# Patient Record
Sex: Female | Born: 1980 | Race: White | Hispanic: No | Smoking: Former smoker
Health system: Southern US, Community
[De-identification: ages and names within clinical notes are randomized; demographics above are authoritative.]

## PROBLEM LIST (undated history)

## (undated) DIAGNOSIS — Z789 Other specified health status: Secondary | ICD-10-CM

## (undated) HISTORY — PX: NO PAST SURGERIES: SHX2092

---

## 2009-08-31 ENCOUNTER — Inpatient Hospital Stay (HOSPITAL_COMMUNITY): Admission: AD | Admit: 2009-08-31 | Discharge: 2009-08-31 | Payer: Self-pay | Admitting: Obstetrics & Gynecology

## 2009-10-10 ENCOUNTER — Inpatient Hospital Stay (HOSPITAL_COMMUNITY): Admission: AD | Admit: 2009-10-10 | Discharge: 2009-10-11 | Payer: Self-pay | Admitting: Obstetrics and Gynecology

## 2010-05-23 NOTE — L&D Delivery Note (Signed)
Delivery Note At  a viable female was delivered via  OA  APGAR: 9 and 9  Placenta status delivered spontaneously and intact with a 2 vessel cord .  Cord pH: not obtained  Anesthesia:  epidural Episiotomy: none Lacerations: none Suture Repair: none Est. Blood Loss (mL): 300  Mom to postpartum.  Baby to nursery-stable.  Nguyet Mercer L 01/18/2011, 10:13 AM

## 2010-07-13 LAB — RPR: RPR: NONREACTIVE

## 2010-07-13 LAB — ABO/RH: RH Type: POSITIVE

## 2010-08-09 LAB — CBC
HCT: 34.8 % — ABNORMAL LOW (ref 36.0–46.0)
MCHC: 33.9 g/dL (ref 30.0–36.0)
MCV: 95.8 fL (ref 78.0–100.0)
Platelets: 160 10*3/uL (ref 150–400)
Platelets: 175 10*3/uL (ref 150–400)
RBC: 3.64 MIL/uL — ABNORMAL LOW (ref 3.87–5.11)
RDW: 13.9 % (ref 11.5–15.5)
WBC: 14.6 10*3/uL — ABNORMAL HIGH (ref 4.0–10.5)

## 2010-08-09 LAB — URIC ACID: Uric Acid, Serum: 4.7 mg/dL (ref 2.4–7.0)

## 2010-08-09 LAB — COMPREHENSIVE METABOLIC PANEL
BUN: 7 mg/dL (ref 6–23)
CO2: 24 mEq/L (ref 19–32)
Chloride: 104 mEq/L (ref 96–112)
Creatinine, Ser: 0.6 mg/dL (ref 0.4–1.2)
GFR calc non Af Amer: >60 mL/min (ref >60)
Total Bilirubin: 0.6 mg/dL (ref 0.3–1.2)

## 2010-08-09 LAB — RPR: RPR Ser Ql: NONREACTIVE

## 2010-08-11 LAB — URINALYSIS, ROUTINE W REFLEX MICROSCOPIC
Nitrite: NEGATIVE
Specific Gravity, Urine: >1.03 — ABNORMAL HIGH (ref 1.005–1.030)
Urobilinogen, UA: 0.2 mg/dL (ref 0.0–1.0)

## 2010-08-11 LAB — URINE CULTURE: Colony Count: 5000

## 2010-08-11 LAB — URINE MICROSCOPIC-ADD ON

## 2010-12-16 ENCOUNTER — Inpatient Hospital Stay (HOSPITAL_COMMUNITY)
Admission: AD | Admit: 2010-12-16 | Discharge: 2010-12-17 | DRG: 782 | Disposition: A | Discharge status: Home / Self Care | Payer: Medicaid Other | Source: Ambulatory Visit | Attending: Obstetrics and Gynecology | Admitting: Obstetrics and Gynecology

## 2010-12-16 ENCOUNTER — Encounter (HOSPITAL_COMMUNITY): Payer: Self-pay | Admitting: *Deleted

## 2010-12-16 DIAGNOSIS — Q270 Congenital absence and hypoplasia of umbilical artery: Secondary | ICD-10-CM

## 2010-12-16 DIAGNOSIS — IMO0002 Reserved for concepts with insufficient information to code with codable children: Secondary | ICD-10-CM

## 2010-12-16 DIAGNOSIS — O365990 Maternal care for other known or suspected poor fetal growth, unspecified trimester, not applicable or unspecified: Principal | ICD-10-CM | POA: Diagnosis present

## 2010-12-16 HISTORY — DX: Reserved for concepts with insufficient information to code with codable children: IMO0002

## 2010-12-16 HISTORY — DX: Other specified health status: Z78.9

## 2010-12-16 HISTORY — DX: Congenital absence and hypoplasia of umbilical artery: Q27.0

## 2010-12-16 MED ORDER — ZOLPIDEM TARTRATE 10 MG PO TABS
10.0000 mg | ORAL_TABLET | Freq: Every evening | ORAL | Status: DC | PRN
  Administered 2010-12-17: 10 mg via ORAL
  Filled 2010-12-16: qty 1

## 2010-12-16 MED ORDER — ACETAMINOPHEN 325 MG PO TABS
650.0000 mg | ORAL_TABLET | ORAL | Status: DC | PRN
  Administered 2010-12-17: 650 mg via ORAL
  Filled 2010-12-16: qty 2

## 2010-12-16 MED ORDER — DOCUSATE SODIUM 100 MG PO CAPS
100.0000 mg | ORAL_CAPSULE | Freq: Every day | ORAL | Status: DC
  Administered 2010-12-17: 100 mg via ORAL
  Filled 2010-12-16: qty 1

## 2010-12-16 MED ORDER — CALCIUM CARBONATE ANTACID 500 MG PO CHEW: 2.0000 | CHEWABLE_TABLET | ORAL | Status: DC | PRN

## 2010-12-16 MED ORDER — BETAMETHASONE SOD PHOS & ACET 6 (3-3) MG/ML IJ SUSP
12.0000 mg | INTRAMUSCULAR | Status: AC
  Administered 2010-12-16 – 2010-12-17 (×2): 12 mg via INTRAMUSCULAR
  Filled 2010-12-16 (×2): qty 2

## 2010-12-16 MED ORDER — PRENATAL PLUS 27-1 MG PO TABS
1.0000 | ORAL_TABLET | Freq: Every day | ORAL | Status: DC
  Administered 2010-12-17: 1 via ORAL
  Filled 2010-12-16: qty 1

## 2010-12-16 NOTE — H&P (Signed)
30 yo G4P3 at 32 +3 wks presents for admission w/ IUGR>  Pt has been followed w/ serial [place] b/c of SUA.  [place] today shows growth at 10% (previous 55%).  AFV and dopplers wnl.  Pt denies VB, ctx, and lof.  Reports good FM.    Past History - See [facility]   SUA   H/o SVD x 3 w/ rapid labor  AF, VSS Gen - NAD Abd - gravid, NT PV - deferred  [place]:  EFW 1420gms (10%), AFI 14.4.  S/D ratio 2.65, 2VC  A/P:  Admit for steroids and fetal monitoring  MFM consult in am.

## 2010-12-17 ENCOUNTER — Encounter (HOSPITAL_COMMUNITY): Payer: Self-pay | Admitting: Radiology

## 2010-12-17 ENCOUNTER — Inpatient Hospital Stay (HOSPITAL_COMMUNITY): Payer: Medicaid Other

## 2010-12-17 IMAGING — US US OB COMP +14 WK
1 series · 14 of 28 positions shown · non-contrast
Comparison: none

[Series 1: us ob comp +14 wk · 0.24mm/px · 14 of 90 slices shown]
[im 4/90]
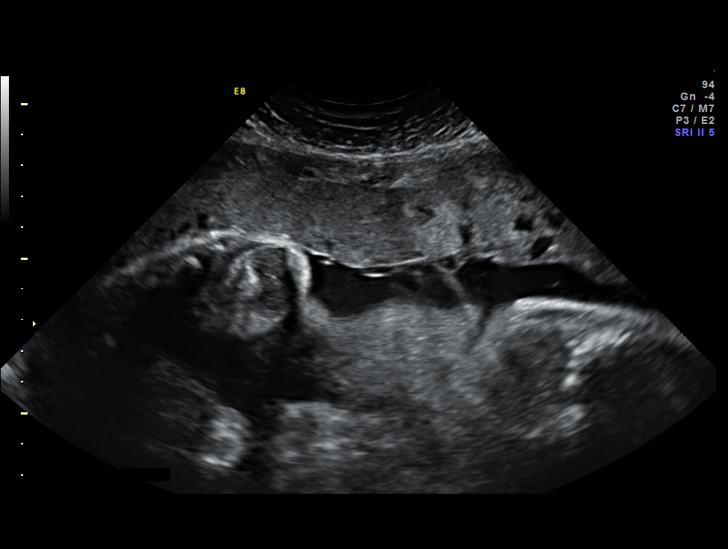
[im 10/90]
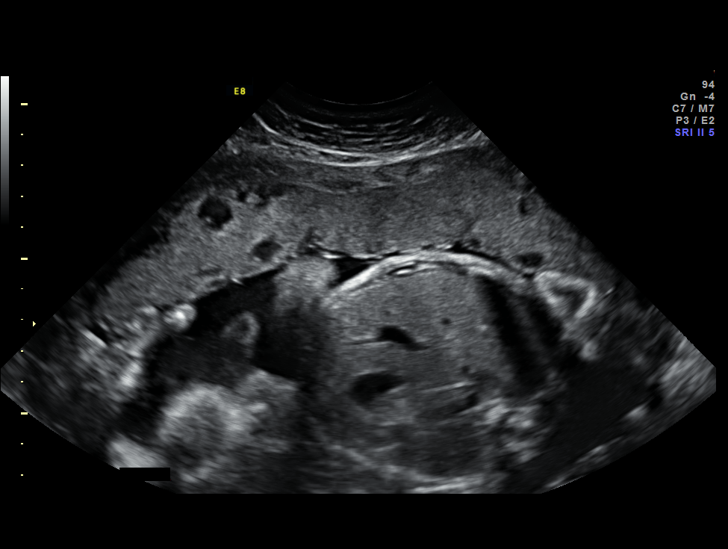
[im 17/90]
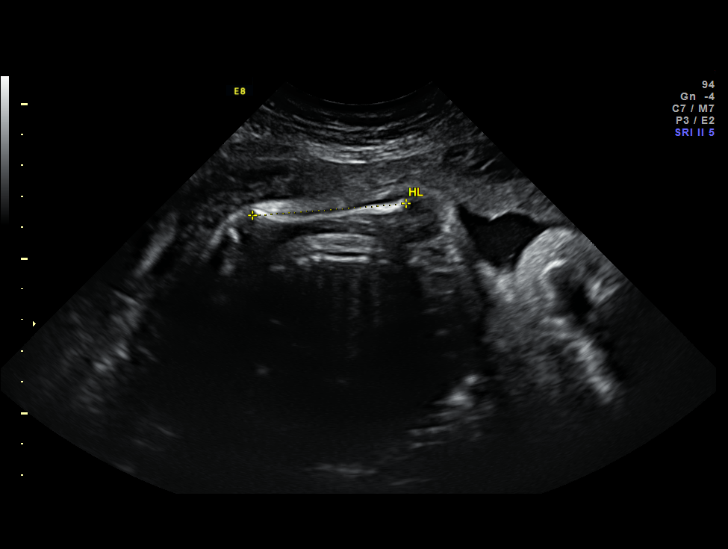
[im 24/90]
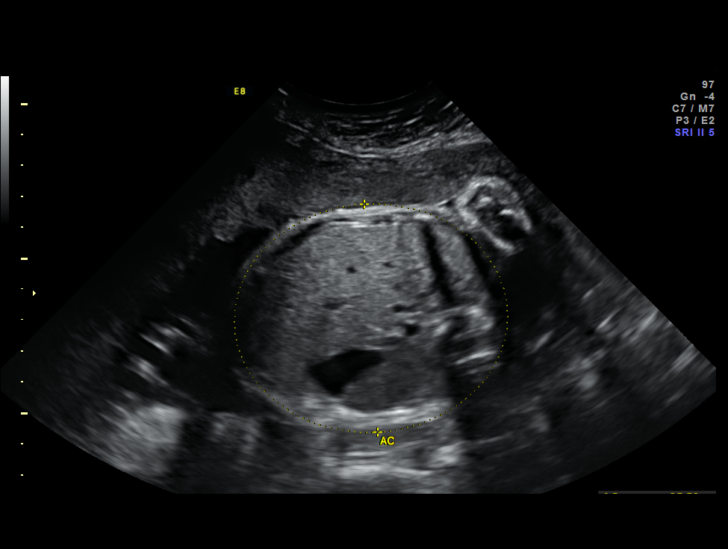
[im 30/90]
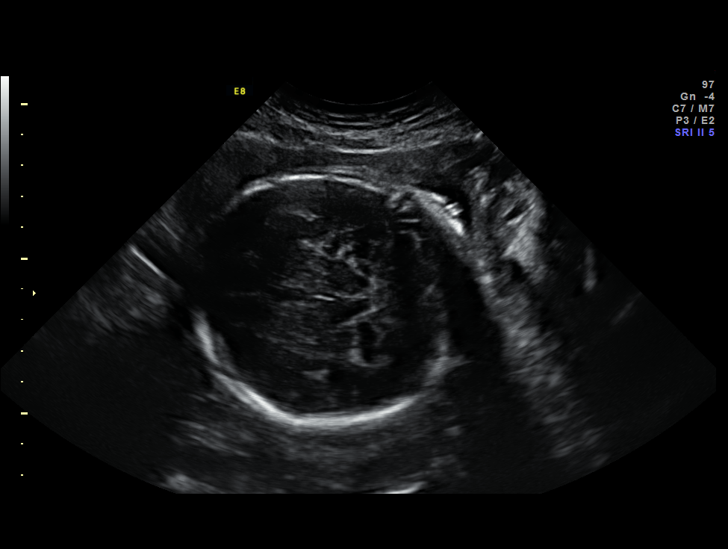
[im 37/90]
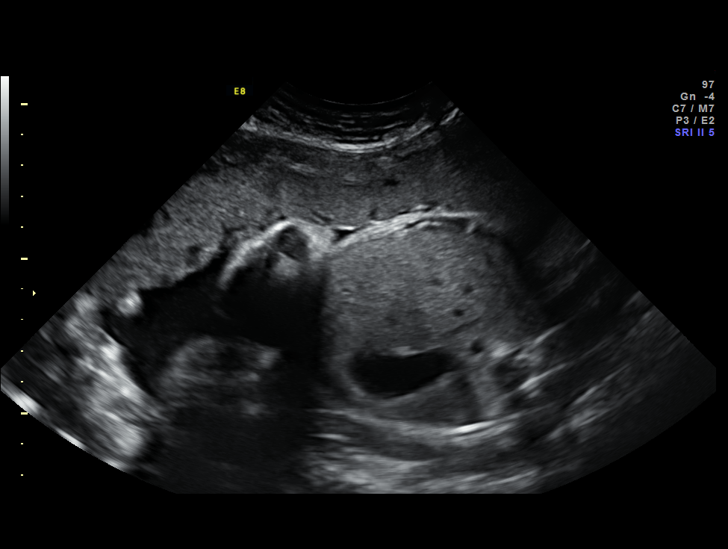
[im 43/90]
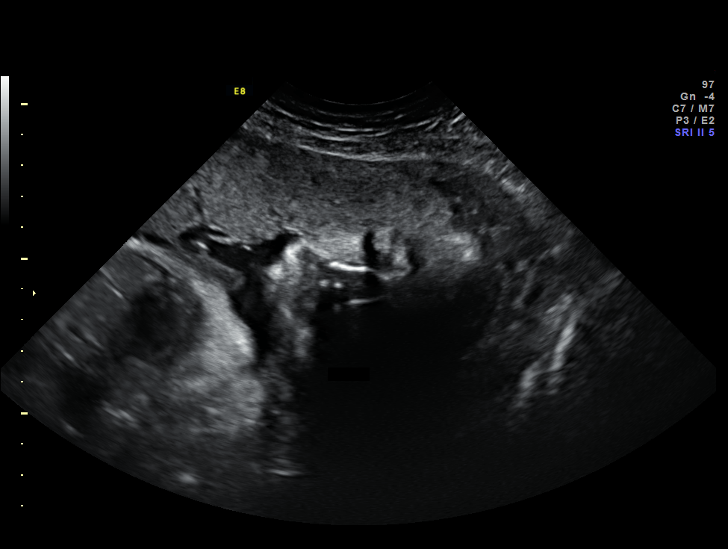
[im 50/90]
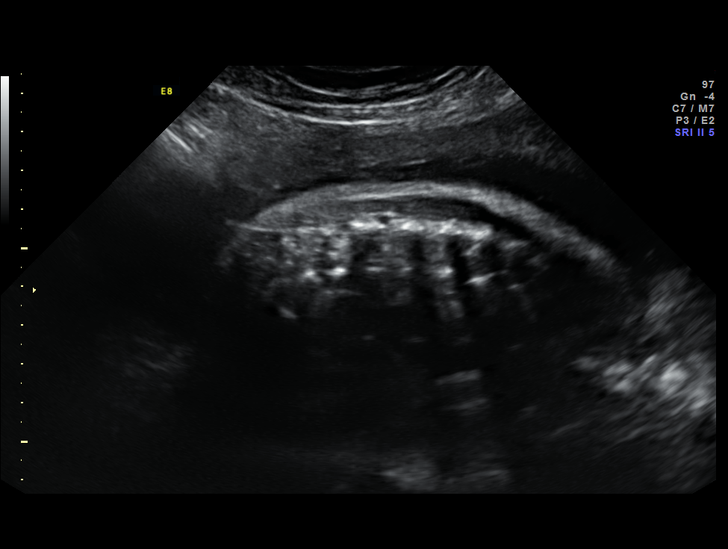
[im 57/90]
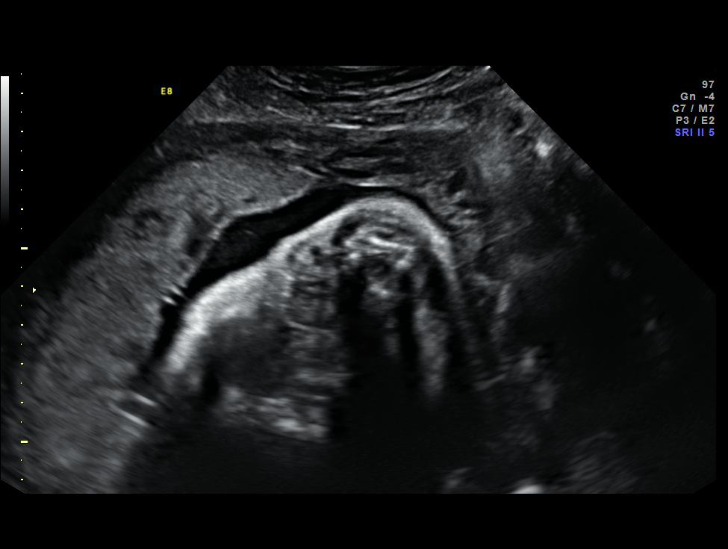
[im 63/90]
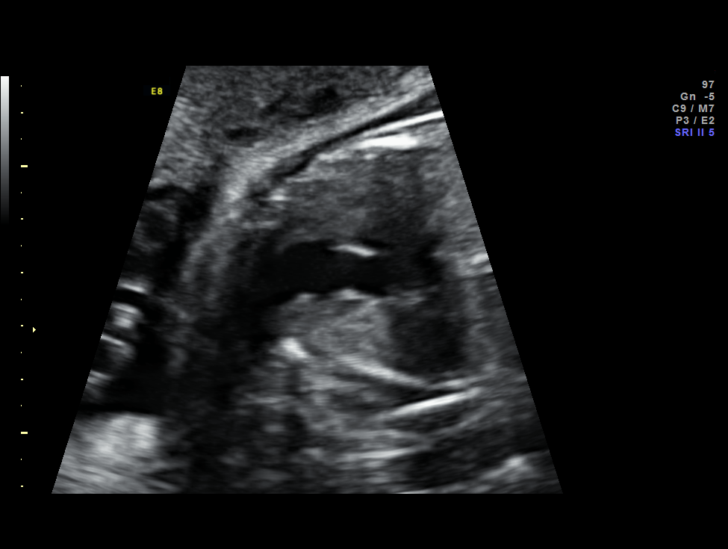
[im 70/90]
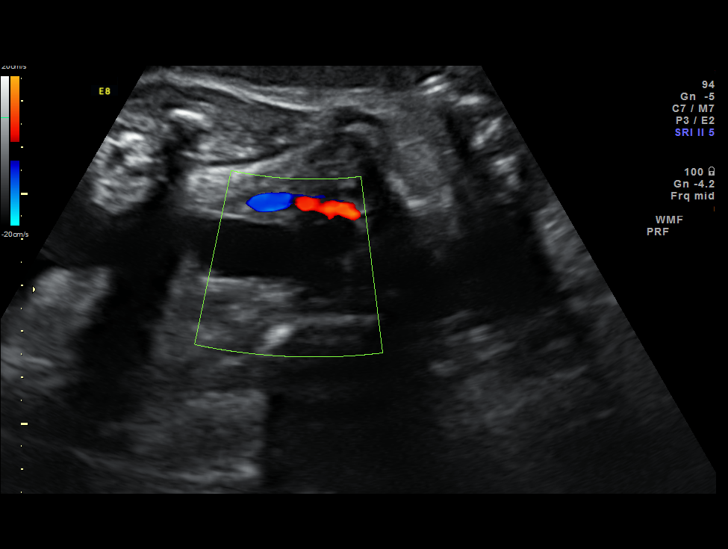
[im 76/90]
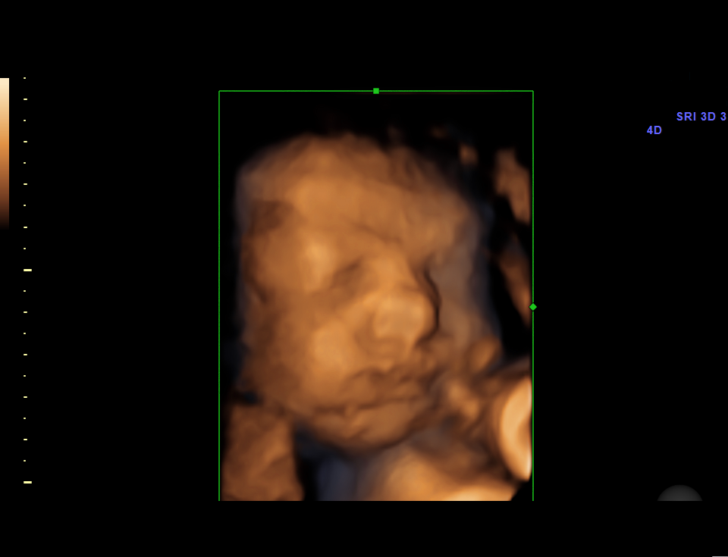
[im 83/90]
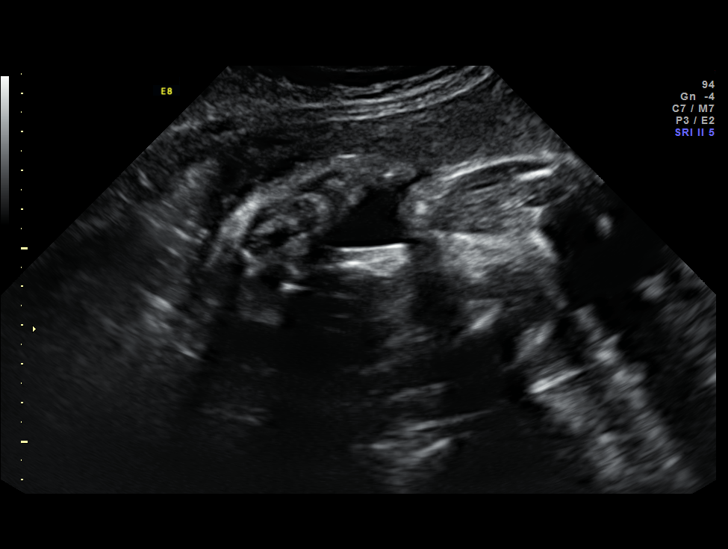
[im 90/90]
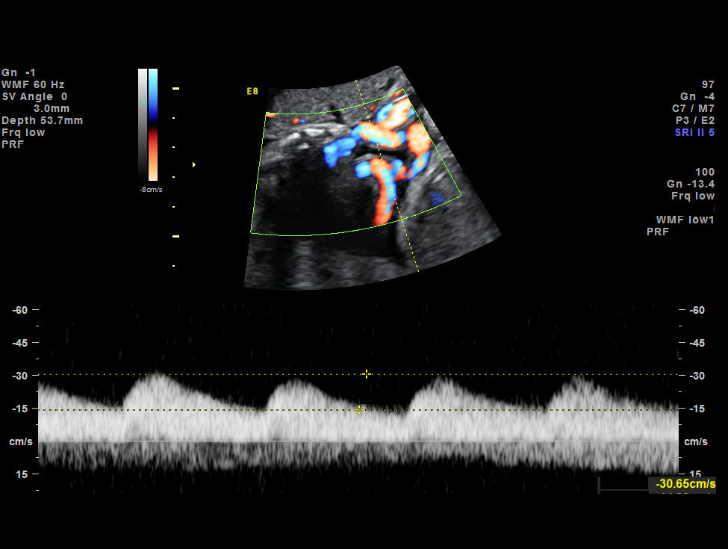

[14 of 28 positions shown; findings below may reference images not displayed]

Canned report from images found in remote index.

Refer to host system for actual result text.

## 2010-12-17 NOTE — Progress Notes (Signed)
Pt without complaints.  Good FM  FHT:  Reassuring, no decels Toco: none  US/MFM consult reviewed.  MFM comfortable with discharge and close outpt mngt.  Plan:  DC home with follow up next week.

## 2010-12-17 NOTE — Progress Notes (Signed)
No complaints, reports good FM  AF, VSS FHT - reassuring w/ accels,  Few small variables overnight. Toco - none  A/P:  S/p BMZ x 1, repeat today          MFM consult this am

## 2010-12-17 NOTE — Discharge Summary (Signed)
Obstetric Discharge Summary Reason for Admission: IUGR Prenatal Procedures: fetal monitoring, steroids, MFM consult  Pt was admitted to antenatal and given 2 doses of BMZ.  Continuous fetal monitoring was reassuring.  MFM was consulted and recommended outpatient management and close follow up.  Ultrasound with Dr Sherrie George showed EFW at 17%, normal AFV and dopplers.   Hemoglobin  Date Value Range Status  10/11/2009 11.9* 12.0-15.0 (g/dL) Final     HCT  Date Value Range Status  10/11/2009 34.3* 36.0-46.0 (%) Final    Discharge Diagnoses: IUGR, SUA  Discharge Information: Date: 12/17/2010 Activity: rest Diet: regular Medications: PNV Condition: stable Instructions: refer to practice specific booklet and Call the office to schedule an appointment for next week Discharge to: home   Rose Hall 12/17/2010, 5:03 PM

## 2011-01-06 LAB — STREP B DNA PROBE: GBS: NEGATIVE

## 2011-01-13 ENCOUNTER — Telehealth (HOSPITAL_COMMUNITY): Payer: Self-pay | Admitting: *Deleted

## 2011-01-13 ENCOUNTER — Encounter (HOSPITAL_COMMUNITY): Payer: Self-pay | Admitting: *Deleted

## 2011-01-13 NOTE — Telephone Encounter (Signed)
Preadmission screen  

## 2011-01-17 ENCOUNTER — Encounter (HOSPITAL_COMMUNITY): Payer: Self-pay

## 2011-01-17 ENCOUNTER — Inpatient Hospital Stay (HOSPITAL_COMMUNITY)
Admission: RE | Admit: 2011-01-17 | Discharge: 2011-01-20 | DRG: 775 | Disposition: A | Discharge status: Home / Self Care | Payer: Medicaid Other | Source: Ambulatory Visit | Attending: Obstetrics and Gynecology | Admitting: Obstetrics and Gynecology

## 2011-01-17 DIAGNOSIS — O365990 Maternal care for other known or suspected poor fetal growth, unspecified trimester, not applicable or unspecified: Principal | ICD-10-CM | POA: Diagnosis present

## 2011-01-17 DIAGNOSIS — Q270 Congenital absence and hypoplasia of umbilical artery: Secondary | ICD-10-CM

## 2011-01-17 DIAGNOSIS — IMO0002 Reserved for concepts with insufficient information to code with codable children: Secondary | ICD-10-CM

## 2011-01-17 LAB — CBC
HCT: 37.9 % (ref 36.0–46.0)
MCHC: 34.3 g/dL (ref 30.0–36.0)
MCV: 92.7 fL (ref 78.0–100.0)
Platelets: 202 10*3/uL (ref 150–400)
RDW: 13.1 % (ref 11.5–15.5)

## 2011-01-17 MED ORDER — LACTATED RINGERS IV SOLN: 500.0000 mL | INTRAVENOUS | Status: DC | PRN

## 2011-01-17 MED ORDER — TERBUTALINE SULFATE 1 MG/ML IJ SOLN: 0.2500 mg | Freq: Once | INTRAMUSCULAR | Status: AC | PRN

## 2011-01-17 MED ORDER — FLEET ENEMA 7-19 GM/118ML RE ENEM: 1.0000 | ENEMA | RECTAL | Status: DC | PRN

## 2011-01-17 MED ORDER — ACETAMINOPHEN 325 MG PO TABS: 650.0000 mg | ORAL_TABLET | ORAL | Status: DC | PRN

## 2011-01-17 MED ORDER — IBUPROFEN 600 MG PO TABS: 600.0000 mg | ORAL_TABLET | Freq: Four times a day (QID) | ORAL | Status: DC | PRN

## 2011-01-17 MED ORDER — ONDANSETRON HCL 4 MG/2ML IJ SOLN
4.0000 mg | Freq: Four times a day (QID) | INTRAMUSCULAR | Status: DC | PRN
  Administered 2011-01-18: 4 mg via INTRAVENOUS
  Filled 2011-01-17 (×2): qty 2

## 2011-01-17 MED ORDER — OXYTOCIN BOLUS FROM INFUSION
500.0000 mL | Freq: Once | INTRAVENOUS | Status: DC
  Filled 2011-01-17: qty 500

## 2011-01-17 MED ORDER — CITRIC ACID-SODIUM CITRATE 334-500 MG/5ML PO SOLN: 30.0000 mL | ORAL | Status: DC | PRN

## 2011-01-17 MED ORDER — LIDOCAINE HCL (PF) 1 % IJ SOLN
30.0000 mL | INTRAMUSCULAR | Status: DC | PRN
  Filled 2011-01-17: qty 30

## 2011-01-17 MED ORDER — DINOPROSTONE 10 MG VA INST
10.0000 mg | VAGINAL_INSERT | Freq: Once | VAGINAL | Status: AC
  Administered 2011-01-17: 10 mg via VAGINAL
  Filled 2011-01-17: qty 1

## 2011-01-17 MED ORDER — BUTORPHANOL TARTRATE 2 MG/ML IJ SOLN: 1.0000 mg | INTRAMUSCULAR | Status: DC | PRN

## 2011-01-17 MED ORDER — OXYTOCIN 20 UNITS IN LACTATED RINGERS INFUSION - SIMPLE: 125.0000 mL/h | INTRAVENOUS | Status: DC

## 2011-01-17 MED ORDER — LACTATED RINGERS IV SOLN
INTRAVENOUS | Status: DC
  Administered 2011-01-17: 22:00:00 via INTRAVENOUS
  Administered 2011-01-18: 125 mL via INTRAVENOUS
  Administered 2011-01-18: 06:00:00 via INTRAVENOUS

## 2011-01-17 MED ORDER — ZOLPIDEM TARTRATE 10 MG PO TABS
10.0000 mg | ORAL_TABLET | Freq: Every evening | ORAL | Status: DC | PRN
  Administered 2011-01-17: 10 mg via ORAL
  Filled 2011-01-17: qty 1

## 2011-01-18 ENCOUNTER — Inpatient Hospital Stay (HOSPITAL_COMMUNITY): Payer: Medicaid Other | Admitting: Anesthesiology

## 2011-01-18 ENCOUNTER — Other Ambulatory Visit: Payer: Self-pay | Admitting: Obstetrics and Gynecology

## 2011-01-18 ENCOUNTER — Encounter (HOSPITAL_COMMUNITY): Payer: Self-pay

## 2011-01-18 ENCOUNTER — Encounter (HOSPITAL_COMMUNITY): Payer: Self-pay | Admitting: Anesthesiology

## 2011-01-18 MED ORDER — MEASLES, MUMPS & RUBELLA VAC ~~LOC~~ INJ: 0.5000 mL | INJECTION | Freq: Once | SUBCUTANEOUS | Status: DC

## 2011-01-18 MED ORDER — FLEET ENEMA 7-19 GM/118ML RE ENEM: 1.0000 | ENEMA | RECTAL | Status: DC | PRN

## 2011-01-18 MED ORDER — LIDOCAINE HCL 1.5 % IJ SOLN
INTRAMUSCULAR | Status: DC | PRN
  Administered 2011-01-18: 5 mL via EPIDURAL
  Administered 2011-01-18: 2 mL via EPIDURAL
  Administered 2011-01-18: 5 mL via EPIDURAL

## 2011-01-18 MED ORDER — WITCH HAZEL-GLYCERIN EX PADS: 1.0000 "application " | MEDICATED_PAD | CUTANEOUS | Status: DC | PRN

## 2011-01-18 MED ORDER — SIMETHICONE 80 MG PO CHEW: 80.0000 mg | CHEWABLE_TABLET | ORAL | Status: DC | PRN

## 2011-01-18 MED ORDER — OXYTOCIN 10 UNIT/ML IJ SOLN
INTRAMUSCULAR | Status: AC
  Administered 2011-01-18: 20 [IU]
  Filled 2011-01-18: qty 2

## 2011-01-18 MED ORDER — OXYTOCIN 20 UNITS IN LACTATED RINGERS INFUSION - SIMPLE: 1.0000 m[IU]/min | INTRAVENOUS | Status: DC

## 2011-01-18 MED ORDER — DIPHENHYDRAMINE HCL 50 MG/ML IJ SOLN: 12.5000 mg | INTRAMUSCULAR | Status: DC | PRN

## 2011-01-18 MED ORDER — MEDROXYPROGESTERONE ACETATE 150 MG/ML IM SUSP: 150.0000 mg | INTRAMUSCULAR | Status: DC | PRN

## 2011-01-18 MED ORDER — BISACODYL 10 MG RE SUPP
10.0000 mg | Freq: Every day | RECTAL | Status: DC | PRN
  Filled 2011-01-18: qty 1

## 2011-01-18 MED ORDER — EPHEDRINE 5 MG/ML INJ: 10.0000 mg | INTRAVENOUS | Status: DC | PRN

## 2011-01-18 MED ORDER — EPHEDRINE 5 MG/ML INJ
10.0000 mg | INTRAVENOUS | Status: DC | PRN
  Filled 2011-01-18: qty 4

## 2011-01-18 MED ORDER — SENNOSIDES-DOCUSATE SODIUM 8.6-50 MG PO TABS
2.0000 | ORAL_TABLET | Freq: Every day | ORAL | Status: DC
  Administered 2011-01-19: 2 via ORAL

## 2011-01-18 MED ORDER — LANOLIN HYDROUS EX OINT: TOPICAL_OINTMENT | CUTANEOUS | Status: DC | PRN

## 2011-01-18 MED ORDER — FENTANYL 2.5 MCG/ML BUPIVACAINE 1/10 % EPIDURAL INFUSION (WH - ANES)
14.0000 mL/h | INTRAMUSCULAR | Status: DC
  Administered 2011-01-18 (×2): 14 mL/h via EPIDURAL
  Filled 2011-01-18: qty 60

## 2011-01-18 MED ORDER — BENZOCAINE-MENTHOL 20-0.5 % EX AERO: 1.0000 "application " | INHALATION_SPRAY | CUTANEOUS | Status: DC | PRN

## 2011-01-18 MED ORDER — ZOLPIDEM TARTRATE 5 MG PO TABS: 5.0000 mg | ORAL_TABLET | Freq: Every evening | ORAL | Status: DC | PRN

## 2011-01-18 MED ORDER — DIPHENHYDRAMINE HCL 25 MG PO CAPS: 25.0000 mg | ORAL_CAPSULE | Freq: Four times a day (QID) | ORAL | Status: DC | PRN

## 2011-01-18 MED ORDER — TETANUS-DIPHTH-ACELL PERTUSSIS 5-2.5-18.5 LF-MCG/0.5 IM SUSP: 0.5000 mL | Freq: Once | INTRAMUSCULAR | Status: DC

## 2011-01-18 MED ORDER — PHENYLEPHRINE 40 MCG/ML (10ML) SYRINGE FOR IV PUSH (FOR BLOOD PRESSURE SUPPORT): 80.0000 ug | PREFILLED_SYRINGE | INTRAVENOUS | Status: DC | PRN

## 2011-01-18 MED ORDER — IBUPROFEN 600 MG PO TABS
600.0000 mg | ORAL_TABLET | Freq: Four times a day (QID) | ORAL | Status: DC
  Administered 2011-01-18 – 2011-01-20 (×7): 600 mg via ORAL
  Filled 2011-01-18 (×7): qty 1

## 2011-01-18 MED ORDER — OXYCODONE-ACETAMINOPHEN 5-325 MG PO TABS: 1.0000 | ORAL_TABLET | ORAL | Status: DC | PRN

## 2011-01-18 MED ORDER — TERBUTALINE SULFATE 1 MG/ML IJ SOLN: 0.2500 mg | Freq: Once | INTRAMUSCULAR | Status: DC | PRN

## 2011-01-18 MED ORDER — PHENYLEPHRINE 40 MCG/ML (10ML) SYRINGE FOR IV PUSH (FOR BLOOD PRESSURE SUPPORT)
80.0000 ug | PREFILLED_SYRINGE | INTRAVENOUS | Status: DC | PRN
  Filled 2011-01-18: qty 5

## 2011-01-18 MED ORDER — PRENATAL PLUS 27-1 MG PO TABS
1.0000 | ORAL_TABLET | Freq: Every day | ORAL | Status: DC
  Administered 2011-01-18 – 2011-01-20 (×3): 1 via ORAL
  Filled 2011-01-18 (×3): qty 1

## 2011-01-18 MED ORDER — LACTATED RINGERS IV SOLN: 500.0000 mL | Freq: Once | INTRAVENOUS | Status: DC

## 2011-01-18 MED ORDER — ONDANSETRON HCL 4 MG/2ML IJ SOLN: 4.0000 mg | INTRAMUSCULAR | Status: DC | PRN

## 2011-01-18 MED ORDER — ONDANSETRON HCL 4 MG PO TABS: 4.0000 mg | ORAL_TABLET | ORAL | Status: DC | PRN

## 2011-01-18 MED ORDER — DIBUCAINE 1 % RE OINT: 1.0000 "application " | TOPICAL_OINTMENT | RECTAL | Status: DC | PRN

## 2011-01-18 NOTE — H&P (Signed)
30 year old G4 P3  [facility] 02/07/2011 admitted for an induction per Dr. Sherrie George.  Her pregnancy is complicated by IUGR  Noted since 32 weeks. Last ultrasound was on August 24 and EFW was 9th percentile and is symmetrically small. The baby has a 2 vessel cord and has been evaluated by MFM. No other abnormalities noted. Dr. Sherrie George recommended induction at 37 weeks because of the IUGR and the 2 vessel cord. GBBS is negative  PNC see [facility] NSVD x 3  History of rapid labor with last baby.  AF VSS General A and Oriented LUNG CTAB CAR RRR ABD soft uterus is less than dates Cervix cervidil is removed from last night Cervix is 80 % 3 to 4 and -1 AROM Clear fluid Vertex  FHR is reassuring Toco irritability  IMP IUGR at 37 weeks 2 vessel cord  PLAN Epidural  Pitocin;

## 2011-01-18 NOTE — Anesthesia Postprocedure Evaluation (Signed)
  Anesthesia Post-op Note  Patient: Rose Hall  Procedure(s) Performed: * No procedures listed *  Patient Location: PACU and Mother/Baby  Anesthesia Type: Epidural  Level of Consciousness: awake, alert  and oriented  Airway and Oxygen Therapy: Patient Spontanous Breathing  Post-op Pain: mild  Post-op Assessment: Patient's Cardiovascular Status Stable, Respiratory Function Stable, Patent Airway, No signs of Nausea or vomiting, Pain level controlled, No headache, No backache, No residual numbness and No residual motor weakness  Post-op Vital Signs: stable  Complications: No apparent anesthesia complications

## 2011-01-18 NOTE — Anesthesia Preprocedure Evaluation (Signed)
Anesthesia Evaluation  Name, MR# and DOB Patient awake  General Assessment Comment  Reviewed: Allergy & Precautions, H&P , NPO status , Patient's Chart, lab work & pertinent test results, reviewed documented beta blocker date and time   History of Anesthesia Complications Negative for: history of anesthetic complications  Airway Mallampati: I TM Distance: >3 FB Neck ROM: full    Dental  (+) Teeth Intact   Pulmonary  clear to auscultation  breath sounds clear to auscultation none    Cardiovascular regular Normal    Neuro/Psych Negative Neurological ROS  Negative Psych ROS  GI/Hepatic/Renal negative GI ROS  negative Liver ROS  negative Renal ROS        Endo/Other  Negative Endocrine ROS (+)      Abdominal   Musculoskeletal   Hematology negative hematology ROS (+)   Peds  Reproductive/Obstetrics (+) Pregnancy    Anesthesia Other Findings             Anesthesia Physical Anesthesia Plan  ASA: II  Anesthesia Plan: Epidural   Post-op Pain Management:    Induction:   Airway Management Planned:   Additional Equipment:   Intra-op Plan:   Post-operative Plan:   Informed Consent: I have reviewed the patients History and Physical, chart, labs and discussed the procedure including the risks, benefits and alternatives for the proposed anesthesia with the patient or authorized representative who has indicated his/her understanding and acceptance.     Plan Discussed with:   Anesthesia Plan Comments:         Anesthesia Quick Evaluation  

## 2011-01-18 NOTE — Anesthesia Procedure Notes (Signed)

## 2011-01-19 LAB — CBC
HCT: 31.7 % — ABNORMAL LOW (ref 36.0–46.0)
Hemoglobin: 10.6 g/dL — ABNORMAL LOW (ref 12.0–15.0)
MCV: 94.3 fL (ref 78.0–100.0)
RBC: 3.36 MIL/uL — ABNORMAL LOW (ref 3.87–5.11)
WBC: 9.6 10*3/uL (ref 4.0–10.5)

## 2011-01-19 NOTE — Progress Notes (Signed)
Post Partum Day 1 Subjective: no complaints, up ad lib and voiding  Objective: Blood pressure 93/62, pulse 68, temperature 98.5 F (36.9 C), temperature source Oral, resp. rate 18, height 5\' 4"  (1.626 m), weight 68.947 kg (152 lb), last menstrual period 05/03/2010, SpO2 96.00%, unknown if currently breastfeeding.  Physical Exam:  General: alert and cooperative Lochia: appropriate Uterine Fundus: firm Perineum intact DVT Evaluation: No evidence of DVT seen on physical exam.   Basename 01/19/11 0525 01/17/11 2130  HGB 10.6* 13.0  HCT 31.7* 37.9    Assessment/Plan: Plan for discharge tomorrow   LOS: 2 days   CURTIS,CAROL G 01/19/2011, 8:07 AM

## 2011-01-19 NOTE — Progress Notes (Signed)
UR chart review completed.  

## 2011-01-20 MED ORDER — IBUPROFEN 600 MG PO TABS: 600.0000 mg | ORAL_TABLET | Freq: Four times a day (QID) | ORAL | Status: AC

## 2011-01-20 MED ORDER — OXYCODONE-ACETAMINOPHEN 5-325 MG PO TABS: 1.0000 | ORAL_TABLET | ORAL | Status: AC | PRN

## 2011-01-20 NOTE — Discharge Summary (Signed)
Obstetric Discharge Summary Reason for Admission: induction of labor Prenatal Procedures: NST and ultrasound Intrapartum Procedures: spontaneous vaginal delivery Postpartum Procedures: none Complications-Operative and Postpartum: none Hemoglobin  Date Value Range Status  01/19/2011 10.6* 12.0-15.0 (g/dL) Final     DELTA CHECK NOTED     REPEATED TO VERIFY     HCT  Date Value Range Status  01/19/2011 31.7* 36.0-46.0 (%) Final    Discharge Diagnoses: Term Pregnancy-delivered  Discharge Information: Date: 01/20/2011 Activity: pelvic rest Diet: routine Medications: PNV, Ibuprophen and Percocet Condition: stable Instructions: refer to practice specific booklet Discharge to: home   Newborn Data: Live born female  Birth Weight: 5 lb 2.7 oz (2345 g) APGAR: 9, 9  Home with mother.  Kailan Carmen G 01/20/2011, 8:27 AM

## 2011-01-20 NOTE — Progress Notes (Signed)
Post Partum Day 2 Subjective: no complaints, up ad lib and voiding  Objective: Blood pressure 122/72, pulse 53, temperature 98.1 F (36.7 C), temperature source Oral, resp. rate 18, height 5\' 4"  (1.626 m), weight 68.947 kg (152 lb), last menstrual period 05/03/2010, SpO2 96.00%, unknown if currently breastfeeding.  Physical Exam:  General: alert and cooperative Lochia: appropriate Uterine Fundus: firm Perineum intact DVT Evaluation: No evidence of DVT seen on physical exam.   Basename 01/19/11 0525 01/17/11 2130  HGB 10.6* 13.0  HCT 31.7* 37.9    Assessment/Plan: Discharge home   LOS: 3 days   CURTIS,CAROL G 01/20/2011, 8:24 AM

## 2014-03-24 ENCOUNTER — Encounter (HOSPITAL_COMMUNITY): Payer: Self-pay

## 2017-07-17 ENCOUNTER — Ambulatory Visit: Payer: 59 | Admitting: Family Medicine

## 2017-07-17 ENCOUNTER — Encounter: Payer: Self-pay | Admitting: Family Medicine

## 2017-07-17 VITALS — BP 113/80 | HR 72 | Temp 98.0°F | Ht 65.0 in | Wt 141.4 lb

## 2017-07-17 DIAGNOSIS — G4700 Insomnia, unspecified: Secondary | ICD-10-CM

## 2017-07-17 DIAGNOSIS — M2550 Pain in unspecified joint: Secondary | ICD-10-CM | POA: Diagnosis not present

## 2017-07-17 DIAGNOSIS — Z7689 Persons encountering health services in other specified circumstances: Secondary | ICD-10-CM

## 2017-07-17 DIAGNOSIS — F419 Anxiety disorder, unspecified: Secondary | ICD-10-CM | POA: Diagnosis not present

## 2017-07-17 MED ORDER — HYDROXYZINE HCL 25 MG PO TABS: 25.0000 mg | ORAL_TABLET | Freq: Three times a day (TID) | ORAL | 0 refills | Status: DC | PRN

## 2017-07-17 NOTE — Progress Notes (Signed)
BP 113/80 (BP Location: Left Arm, Patient Position: Sitting, Cuff Size: Normal)   Pulse 72   Temp 98 F (36.7 C) (Tympanic)   Ht 5' 5"  (1.651 m)   Wt 141 lb 6.4 oz (64.1 kg)   SpO2 99%   BMI 23.53 kg/m    Subjective:    Patient ID: Rose Hall, female    DOB: Nov 22, 1980, 37 y.o.   MRN: [id number]  HPI: Rose Hall is a 37 y.o. female  Chief Complaint  Patient presents with  . Establish Care  . Elbow Pain    Patient stated at the beginning of January she had elbow pain and could barely use her arm. It has got slightly better but patient states she can't can't straighten her arm fully.   Pt here today to establish care. No known medical problems, not taking any medications. Last CPE was about 2 years ago.   Has several concerns today. 6 months of joint pains b/l knees, upper legs, hips, and right elbow. Some redness and swelling at times in b/l knees. Tries exercises, tylenol, heat, ice with occasional relief. Mother with OA, RA, and fibromyalgia. Denies fevers, fatigue, joint deformities, past orthopedic injuries.   1 year of restlessness and anxiety, sometimes will get severe and having shaking episodes with these severe episodes. Now also coming with insomnia. Sleeping 3 or so hours nightly because she can't "shut her brain off". Denies crying spells, SI/HI, hallucinations.   Relevant past medical, surgical, family and social history reviewed and updated as indicated. Interim medical history since our last visit reviewed. Allergies and medications reviewed and updated.  Review of Systems  Per HPI unless specifically indicated above     Objective:    BP 113/80 (BP Location: Left Arm, Patient Position: Sitting, Cuff Size: Normal)   Pulse 72   Temp 98 F (36.7 C) (Tympanic)   Ht 5' 5"  (1.651 m)   Wt 141 lb 6.4 oz (64.1 kg)   SpO2 99%   BMI 23.53 kg/m   Wt Readings from Last 3 Encounters:  07/17/17 141 lb 6.4 oz (64.1 kg)  01/18/11 152 lb (68.9 kg)  12/16/10 152  lb (68.9 kg)    Physical Exam  Constitutional: She is oriented to person, place, and time. She appears well-developed and well-nourished. No distress.  HENT:  Head: Atraumatic.  Eyes: Conjunctivae are normal. Pupils are equal, round, and reactive to light.  Neck: Normal range of motion. Neck supple.  Cardiovascular: Normal rate and normal heart sounds.  Pulmonary/Chest: Effort normal and breath sounds normal. No respiratory distress.  Musculoskeletal: Normal range of motion. She exhibits no edema or deformity.  Neurological: She is alert and oriented to person, place, and time.  Skin: Skin is warm and dry.  Psychiatric: She has a normal mood and affect. Her behavior is normal.  Nursing note and vitals reviewed.  Results for orders placed or performed in visit on 07/17/17  Sed Rate (ESR)  Result Value Ref Range   Sed Rate 2 0 - 32 mm/hr  Rheumatoid Factor  Result Value Ref Range   Rhuematoid fact SerPl-aCnc <10.0 0.0 - 13.9 IU/mL  ANA w/Reflex  Result Value Ref Range   Anit Nuclear Antibody(ANA) Negative Negative      Assessment & Plan:   Problem List Items Addressed This Visit    None    Visit Diagnoses    Arthralgia, unspecified joint    -  Primary   Will r/o inflammatory/autoimmune causes with some screening labs,  discussed conservative management in meantime with NSAIDs, topicals, exercise   Relevant Orders   Sed Rate (ESR) (Completed)   Rheumatoid Factor (Completed)   ANA w/Reflex (Completed)   Encounter to establish care       Insomnia, unspecified type       Will start prn hydroxyzine to help with sleep. Discussed risks and benefits. Monitor closely for benefit   Anxiety       Will try prn hydroxyzine for significant episodes as well as sleep. Side effects reviewed. Discussed counseling, pt will consider   Relevant Medications   hydrOXYzine (ATARAX/VISTARIL) 25 MG tablet       Follow up plan: Return in about 4 weeks (around 08/14/2017) for CPE, anxiety  f/u.

## 2017-07-17 NOTE — Patient Instructions (Addendum)
Hydroxyzine capsules or tablets What is this medicine? HYDROXYZINE (hye DROX i zeen) is an antihistamine. This medicine is used to treat allergy symptoms. It is also used to treat anxiety and tension. This medicine can be used with other medicines to induce sleep before surgery. This medicine may be used for other purposes; ask your health care provider or pharmacist if you have questions. COMMON BRAND NAME(S): ANX, Atarax, Rezine, Vistaril What should I tell my health care provider before I take this medicine? They need to know if you have any of these conditions: -any chronic illness -difficulty passing urine -glaucoma -heart disease -kidney disease -liver disease -lung disease -an unusual or allergic reaction to hydroxyzine, cetirizine, other medicines, foods, dyes, or preservatives -pregnant or trying to get pregnant -breast-feeding How should I use this medicine? Take this medicine by mouth with a full glass of water. Follow the directions on the prescription label. You may take this medicine with food or on an empty stomach. Take your medicine at regular intervals. Do not take your medicine more often than directed. Talk to your pediatrician regarding the use of this medicine in children. Special care may be needed. While this drug may be prescribed for children as young as 6 years of age for selected conditions, precautions do apply. Patients over 65 years old may have a stronger reaction and need a smaller dose. Overdosage: If you think you have taken too much of this medicine contact a poison control center or emergency room at once. NOTE: This medicine is only for you. Do not share this medicine with others. What if I miss a dose? If you miss a dose, take it as soon as you can. If it is almost time for your next dose, take only that dose. Do not take double or extra doses. What may interact with this medicine? -alcohol -barbiturate medicines for sleep or seizures -medicines for  colds, allergies -medicines for depression, anxiety, or emotional disturbances -medicines for pain -medicines for sleep -muscle relaxants This list may not describe all possible interactions. Give your health care provider a list of all the medicines, herbs, non-prescription drugs, or dietary supplements you use. Also tell them if you smoke, drink alcohol, or use illegal drugs. Some items may interact with your medicine. What should I watch for while using this medicine? Tell your doctor or health care professional if your symptoms do not improve. You may get drowsy or dizzy. Do not drive, use machinery, or do anything that needs mental alertness until you know how this medicine affects you. Do not stand or sit up quickly, especially if you are an older patient. This reduces the risk of dizzy or fainting spells. Alcohol may interfere with the effect of this medicine. Avoid alcoholic drinks. Your mouth may get dry. Chewing sugarless gum or sucking hard candy, and drinking plenty of water may help. Contact your doctor if the problem does not go away or is severe. This medicine may cause dry eyes and blurred vision. If you wear contact lenses you may feel some discomfort. Lubricating drops may help. See your eye doctor if the problem does not go away or is severe. If you are receiving skin tests for allergies, tell your doctor you are using this medicine. What side effects may I notice from receiving this medicine? Side effects that you should report to your doctor or health care professional as soon as possible: -fast or irregular heartbeat -difficulty passing urine -seizures -slurred speech or confusion -tremor Side effects that   usually do not require medical attention (report to your doctor or health care professional if they continue or are bothersome): -constipation -drowsiness -fatigue -headache -stomach upset This list may not describe all possible side effects. Call your doctor for  medical advice about side effects. You may report side effects to FDA at 1-800-FDA-1088. Where should I keep my medicine? Keep out of the reach of children. Store at room temperature between 15 and 30 degrees C (59 and 86 degrees F). Keep container tightly closed. Throw away any unused medicine after the expiration date. NOTE: This sheet is a summary. It may not cover all possible information. If you have questions about this medicine, talk to your doctor, pharmacist, or health care provider.  2018 [employer]vier/Gold Standard (2007-09-21 14:50:59)  

## 2017-07-18 LAB — RHEUMATOID FACTOR: Rhuematoid fact SerPl-aCnc: <10 IU/mL (ref 0.0–13.9)

## 2017-07-18 LAB — ANA W/REFLEX: Anti Nuclear Antibody(ANA): NEGATIVE

## 2017-07-18 LAB — SEDIMENTATION RATE: SED RATE: 2 mm/h (ref 0–32)

## 2017-08-16 ENCOUNTER — Ambulatory Visit (INDEPENDENT_AMBULATORY_CARE_PROVIDER_SITE_OTHER): Payer: 59 | Admitting: Family Medicine

## 2017-08-16 ENCOUNTER — Encounter: Payer: Self-pay | Admitting: Family Medicine

## 2017-08-16 VITALS — BP 120/86 | HR 83 | Temp 98.5°F | Ht 65.0 in | Wt 139.5 lb

## 2017-08-16 DIAGNOSIS — F419 Anxiety disorder, unspecified: Secondary | ICD-10-CM | POA: Diagnosis not present

## 2017-08-16 DIAGNOSIS — Z0000 Encounter for general adult medical examination without abnormal findings: Secondary | ICD-10-CM

## 2017-08-16 DIAGNOSIS — Z0001 Encounter for general adult medical examination with abnormal findings: Secondary | ICD-10-CM | POA: Diagnosis not present

## 2017-08-16 DIAGNOSIS — M25521 Pain in right elbow: Secondary | ICD-10-CM | POA: Diagnosis not present

## 2017-08-16 DIAGNOSIS — G8929 Other chronic pain: Secondary | ICD-10-CM | POA: Diagnosis not present

## 2017-08-16 DIAGNOSIS — Z23 Encounter for immunization: Secondary | ICD-10-CM

## 2017-08-16 LAB — UA/M W/RFLX CULTURE, ROUTINE
Bilirubin, UA: NEGATIVE
Glucose, UA: NEGATIVE
Ketones, UA: NEGATIVE
LEUKOCYTES UA: NEGATIVE
Nitrite, UA: NEGATIVE
PH UA: 8.5 — AB (ref 5.0–7.5)
Specific Gravity, UA: 1.015 (ref 1.005–1.030)
Urobilinogen, Ur: 0.2 mg/dL (ref 0.2–1.0)

## 2017-08-16 LAB — MICROSCOPIC EXAMINATION: WBC UA: NONE SEEN /HPF (ref 0–5)

## 2017-08-16 MED ORDER — PREDNISONE 10 MG PO TABS: ORAL_TABLET | ORAL | 0 refills | Status: DC

## 2017-08-16 MED ORDER — CYCLOBENZAPRINE HCL 10 MG PO TABS: 10.0000 mg | ORAL_TABLET | Freq: Every evening | ORAL | 0 refills | Status: DC | PRN

## 2017-08-16 MED ORDER — HYDROXYZINE HCL 10 MG PO TABS: 10.0000 mg | ORAL_TABLET | Freq: Three times a day (TID) | ORAL | 1 refills | Status: DC | PRN

## 2017-08-16 NOTE — Progress Notes (Signed)
BP 120/86   Pulse 83   Temp 98.5 F (36.9 C) (Oral)   Ht 5\' 5"  (1.651 m)   Wt 139 lb 8 oz (63.3 kg)   LMP 08/02/2017 (Approximate)   SpO2 99%   BMI 23.21 kg/m    Subjective:    Patient ID: Rose Hall, female    DOB: 1980/05/28, 37 y.o.   MRN: [id number]  HPI: Rose Hall is a 37 y.o. female presenting on 08/16/2017 for comprehensive medical examination. Current medical complaints include:see below  Still having right elbow pain, but generalized joint pains seem some better since last visit. Autoimmune/inflammatory labs all came back normal. Using heat, OTC pain relievers, and stretches with no relief. No known injury. Losing strength in right arm due to the pain.   Started hydroxyzine prn for her anxiety, states this is going well. No side effects, just having to take half tab as it makes her groggy.   Not fasting for labs today  Depression Screen done today and results listed below:  Depression screen [facility] 2/9 08/16/2017  Decreased Interest 0  Down, Depressed, Hopeless 0  PHQ - 2 Score 0  Altered sleeping 1  Tired, decreased energy 2  Change in appetite 0  Feeling bad or failure about yourself  0  Trouble concentrating 1  Moving slowly or fidgety/restless 0  Suicidal thoughts 0  PHQ-9 Score 4    The patient does not have a history of falls. I did not complete a risk assessment for falls. A plan of care for falls was not documented.   Past Medical History:  Past Medical History:  Diagnosis Date  . No pertinent past medical history     Surgical History:  Past Surgical History:  Procedure Laterality Date  . NO PAST SURGERIES      Medications:  No current outpatient medications on file prior to visit.   No current facility-administered medications on file prior to visit.     Allergies:  Allergies  Allergen Reactions  . Codeine Nausea And Vomiting    Social History:  Social History   Socioeconomic History  . Marital status: Divorced    Spouse  name: Not on file  . Number of children: Not on file  . Years of education: Not on file  . Highest education level: Not on file  Occupational History  . Occupation: Hotel manager: [employer]  Social Needs  . Financial resource strain: Not on file  . Food insecurity:    Worry: Not on file    Inability: Not on file  . Transportation needs:    Medical: Not on file    Non-medical: Not on file  Tobacco Use  . Smoking status: Former Smoker    Types: Cigarettes    Last attempt to quit: 12/16/1998    Years since quitting: 18.6  . Smokeless tobacco: Never Used  Substance and Sexual Activity  . Alcohol use: No  . Drug use: No  . Sexual activity: Yes    Partners: Male    Birth control/protection: None    Comment: tubal  Lifestyle  . Physical activity:    Days per week: Not on file    Minutes per session: Not on file  . Stress: Not on file  Relationships  . Social connections:    Talks on phone: Not on file    Gets together: Not on file    Attends religious service: Not on file    Active member of club  or organization: Not on file    Attends meetings of clubs or organizations: Not on file    Relationship status: Not on file  . Intimate partner violence:    Fear of current or ex partner: Not on file    Emotionally abused: Not on file    Physically abused: Not on file    Forced sexual activity: Not on file  Other Topics Concern  . Not on file  Social History Narrative  . Not on file   Social History   Tobacco Use  Smoking Status Former Smoker  . Types: Cigarettes  . Last attempt to quit: 12/16/1998  . Years since quitting: 18.6  Smokeless Tobacco Never Used   Social History   Substance and Sexual Activity  Alcohol Use No    Family History:  Family History  Problem Relation Age of Onset  . Cancer Paternal Grandmother   . Fibromyalgia Mother   . Arthritis Mother     Past medical history, surgical history, medications, allergies, family history and social  history reviewed with patient today and changes made to appropriate areas of the chart.   Review of Systems - General ROS: negative Psychological ROS: positive for - anxiety Ophthalmic ROS: negative ENT ROS: negative Allergy and Immunology ROS: negative Breast ROS: negative for breast lumps Respiratory ROS: no cough, shortness of breath, or wheezing Cardiovascular ROS: no chest pain or dyspnea on exertion Gastrointestinal ROS: no abdominal pain, change in bowel habits, or black or bloody stools Genito-Urinary ROS: no dysuria, trouble voiding, or hematuria Musculoskeletal ROS: positive for - right elbow pain Neurological ROS: no TIA or stroke symptoms Dermatological ROS: negative All other ROS negative except what is listed above and in the HPI.      Objective:    BP 120/86   Pulse 83   Temp 98.5 F (36.9 C) (Oral)   Ht 5\' 5"  (1.651 m)   Wt 139 lb 8 oz (63.3 kg)   LMP 08/02/2017 (Approximate)   SpO2 99%   BMI 23.21 kg/m   Wt Readings from Last 3 Encounters:  08/16/17 139 lb 8 oz (63.3 kg)  07/17/17 141 lb 6.4 oz (64.1 kg)  01/18/11 152 lb (68.9 kg)    Physical Exam  Constitutional: She is oriented to person, place, and time. She appears well-developed and well-nourished. No distress.  HENT:  Head: Atraumatic.  Right Ear: External ear normal.  Left Ear: External ear normal.  Nose: Nose normal.  Mouth/Throat: Oropharynx is clear and moist. No oropharyngeal exudate.  Eyes: Pupils are equal, round, and reactive to light. Conjunctivae are normal. No scleral icterus.  Neck: Normal range of motion. Neck supple. No thyromegaly present.  Cardiovascular: Normal rate, regular rhythm, normal heart sounds and intact distal pulses.  Pulmonary/Chest: Effort normal and breath sounds normal. No respiratory distress.  Abdominal: Soft. Bowel sounds are normal. She exhibits no mass. There is no tenderness.  Musculoskeletal: Normal range of motion. She exhibits no edema or tenderness.    Decreased grip strength right hand  Lymphadenopathy:    She has no cervical adenopathy.  Neurological: She is alert and oriented to person, place, and time. No cranial nerve deficit.  Skin: Skin is warm and dry. No rash noted.  Psychiatric: She has a normal mood and affect. Her behavior is normal.  Nursing note and vitals reviewed.   Results for orders placed or performed in visit on 08/16/17  Microscopic Examination  Result Value Ref Range   WBC, UA None seen 0 -  5 /hpf   RBC, UA 0-2 0 - 2 /hpf   Epithelial Cells (non renal) 0-10 0 - 10 /hpf   Bacteria, UA Few None seen/Few  CBC with Differential/Platelet  Result Value Ref Range   WBC 8.0 3.4 - 10.8 x10E3/uL   RBC 4.29 3.77 - 5.28 x10E6/uL   Hemoglobin 13.5 11.1 - 15.9 g/dL   Hematocrit 16.1 09.6 - 46.6 %   MCV 91 79 - 97 fL   MCH 31.5 26.6 - 33.0 pg   MCHC 34.5 31.5 - 35.7 g/dL   RDW 04.5 40.9 - 81.1 %   Platelets 239 150 - 379 x10E3/uL   Neutrophils 58 Not Estab. %   Lymphs 34 Not Estab. %   Monocytes 7 Not Estab. %   Eos 1 Not Estab. %   Basos 0 Not Estab. %   Neutrophils Absolute 4.6 1.4 - 7.0 x10E3/uL   Lymphocytes Absolute 2.7 0.7 - 3.1 x10E3/uL   Monocytes Absolute 0.5 0.1 - 0.9 x10E3/uL   EOS (ABSOLUTE) 0.1 0.0 - 0.4 x10E3/uL   Basophils Absolute 0.0 0.0 - 0.2 x10E3/uL   Immature Granulocytes 0 Not Estab. %   Immature Grans (Abs) 0.0 0.0 - 0.1 x10E3/uL  Comprehensive metabolic panel  Result Value Ref Range   Glucose 82 65 - 99 mg/dL   BUN 13 6 - 20 mg/dL   Creatinine, Ser 9.14 0.57 - 1.00 mg/dL   GFR calc non Af Amer 78 >59 mL/min/1.73   GFR calc Af Amer 90 >59 mL/min/1.73   BUN/Creatinine Ratio 14 9 - 23   Sodium 139 134 - 144 mmol/L   Potassium 4.2 3.5 - 5.2 mmol/L   Chloride 100 96 - 106 mmol/L   CO2 24 20 - 29 mmol/L   Calcium 9.4 8.7 - 10.2 mg/dL   Total Protein 6.7 6.0 - 8.5 g/dL   Albumin 4.6 3.5 - 5.5 g/dL   Globulin, Total 2.1 1.5 - 4.5 g/dL   Albumin/Globulin Ratio 2.2 1.2 - 2.2   Bilirubin  Total 0.5 0.0 - 1.2 mg/dL   Alkaline Phosphatase 68 39 - 117 IU/L   AST 19 0 - 40 IU/L   ALT 12 0 - 32 IU/L  Lipid Panel w/o Chol/HDL Ratio  Result Value Ref Range   Cholesterol, Total 128 100 - 199 mg/dL   Triglycerides 45 0 - 149 mg/dL   HDL 73 >78 mg/dL   VLDL Cholesterol Cal 9 5 - 40 mg/dL   LDL Calculated 46 0 - 99 mg/dL  TSH  Result Value Ref Range   TSH 1.340 0.450 - 4.500 uIU/mL  UA/M w/rflx Culture, Routine  Result Value Ref Range   Specific Gravity, UA 1.015 1.005 - 1.030   pH, UA 8.5 (H) 5.0 - 7.5   Color, UA Straw Yellow   Appearance Ur Turbid (A) Clear   Leukocytes, UA Negative Negative   Protein, UA 1+ (A) Negative/Trace   Glucose, UA Negative Negative   Ketones, UA Negative Negative   RBC, UA 1+ (A) Negative   Bilirubin, UA Negative Negative   Urobilinogen, Ur 0.2 0.2 - 1.0 mg/dL   Nitrite, UA Negative Negative   Microscopic Examination See below:       Assessment & Plan:   Problem List Items Addressed This Visit      Other   Anxiety - Primary    Good improvement with hydroxyzine prn. Continue current regimen, reiterated importance and benefit of counseling.       Relevant Medications  hydrOXYzine (ATARAX/VISTARIL) 10 MG tablet    Other Visit Diagnoses    Annual physical exam       Relevant Orders   CBC with Differential/Platelet (Completed)   Comprehensive metabolic panel (Completed)   Lipid Panel w/o Chol/HDL Ratio (Completed)   TSH (Completed)   UA/M w/rflx Culture, Routine (Completed)   Chronic elbow pain, right       Persistent despite good OTC care and pain relievers. Will try prednisone taper and flexeril, taping, braces, exercises. Ortho referral if no improvement   Relevant Medications   predniSONE (DELTASONE) 10 MG tablet   cyclobenzaprine (FLEXERIL) 10 MG tablet   Need for diphtheria-tetanus-pertussis (Tdap) vaccine       Relevant Orders   Tdap vaccine greater than or equal to 7yo IM (Completed)       Follow up plan: Return in  about 1 year (around 08/17/2018) for CPE.   LABORATORY TESTING:  - Pap smear: up to date  IMMUNIZATIONS:   - Tdap: Tetanus vaccination status reviewed: Td vaccination indicated and given today. - Influenza: Refused  PATIENT COUNSELING:   Advised to take 1 mg of folate supplement per day if capable of pregnancy.   Sexuality: Discussed sexually transmitted diseases, partner selection, use of condoms, avoidance of unintended pregnancy  and contraceptive alternatives.   Advised to avoid cigarette smoking.  I discussed with the patient that most people either abstain from alcohol or drink within safe limits (<=14/week and <=4 drinks/occasion for males, <=7/weeks and <= 3 drinks/occasion for females) and that the risk for alcohol disorders and other health effects rises proportionally with the number of drinks per week and how often a drinker exceeds daily limits.  Discussed cessation/primary prevention of drug use and availability of treatment for abuse.   Diet: Encouraged to adjust caloric intake to maintain  or achieve ideal body weight, to reduce intake of dietary saturated fat and total fat, to limit sodium intake by avoiding high sodium foods and not adding table salt, and to maintain adequate dietary potassium and calcium preferably from fresh fruits, vegetables, and low-fat dairy products.    stressed the importance of regular exercise  Injury prevention: Discussed safety belts, safety helmets, smoke detector, smoking near bedding or upholstery.   Dental health: Discussed importance of regular tooth brushing, flossing, and dental visits.    NEXT PREVENTATIVE PHYSICAL DUE IN 1 YEAR. Return in about 1 year (around 08/17/2018) for CPE.

## 2017-08-16 NOTE — Patient Instructions (Addendum)
Kinesiology Tape for right elbow pain Can also wear a tendonitis brace on the elbow  Tdap Vaccine (Tetanus, Diphtheria and Pertussis): What You Need to Know 1. Why get vaccinated? Tetanus, diphtheria and pertussis are very serious diseases. Tdap vaccine can protect us from these diseases. And, Tdap vaccine given to pregnant women can protect newborn babies against pertussis. TETANUS (Lockjaw) is rare in the [place] today. It causes painful muscle tightening and stiffness, usually all over the body.  It can lead to tightening of muscles in the head and neck so you can't open your mouth, swallow, or sometimes even breathe. Tetanus kills about 1 out of 10 people who are infected even after receiving the best medical care.  DIPHTHERIA is also rare in the [place] today. It can cause a thick coating to form in the back of the throat.  It can lead to breathing problems, heart failure, paralysis, and death.  PERTUSSIS (Whooping Cough) causes severe coughing spells, which can cause difficulty breathing, vomiting and disturbed sleep.  It can also lead to weight loss, incontinence, and rib fractures. Up to 2 in 100 adolescents and 5 in 100 adults with pertussis are hospitalized or have complications, which could include pneumonia or death.  These diseases are caused by bacteria. Diphtheria and pertussis are spread from person to person through secretions from coughing or sneezing. Tetanus enters the body through cuts, scratches, or wounds. Before vaccines, as many as 200,000 cases of diphtheria, 200,000 cases of pertussis, and hundreds of cases of tetanus, were reported in the [place] each year. Since vaccination began, reports of cases for tetanus and diphtheria have dropped by about 99% and for pertussis by about 80%. 2. Tdap vaccine Tdap vaccine can protect adolescents and adults from tetanus, diphtheria, and pertussis. One dose of Tdap is routinely given at age 35 or 4312. People who  did not get Tdap at that age should get it as soon as possible. Tdap is especially important for healthcare professionals and anyone having close contact with a baby younger than 12 months. Pregnant women should get a dose of Tdap during every pregnancy, to protect the newborn from pertussis. Infants are most at risk for severe, life-threatening complications from pertussis. Another vaccine, called Td, protects against tetanus and diphtheria, but not pertussis. A Td booster should be given every 10 years. Tdap may be given as one of these boosters if you have never gotten Tdap before. Tdap may also be given after a severe cut or burn to prevent tetanus infection. Your doctor or the person giving you the vaccine can give you more information. Tdap may safely be given at the same time as other vaccines. 3. Some people should not get this vaccine  A person who has ever had a life-threatening allergic reaction after a previous dose of any diphtheria, tetanus or pertussis containing vaccine, OR has a severe allergy to any part of this vaccine, should not get Tdap vaccine. Tell the person giving the vaccine about any severe allergies.  Anyone who had coma or long repeated seizures within 7 days after a childhood dose of DTP or DTaP, or a previous dose of Tdap, should not get Tdap, unless a cause other than the vaccine was found. They can still get Td.  Talk to your doctor if you: ? have seizures or another nervous system problem, ? had severe pain or swelling after any vaccine containing diphtheria, tetanus or pertussis, ? ever had a condition called Guillain-Barr Syndrome (GBS), ?  aren't feeling well on the day the shot is scheduled. 4. Risks With any medicine, including vaccines, there is a chance of side effects. These are usually mild and go away on their own. Serious reactions are also possible but are rare. Most people who get Tdap vaccine do not have any problems with it. Mild problems  following Tdap: (Did not interfere with activities)  Pain where the shot was given (about 3 in 4 adolescents or 2 in 3 adults)  Redness or swelling where the shot was given (about 1 person in 5)  Mild fever of at least 100.3F (up to about 1 in 25 adolescents or 1 in 100 adults)  Headache (about 3 or 4 people in 10)  Tiredness (about 1 person in 3 or 4)  Nausea, vomiting, diarrhea, stomach ache (up to 1 in 4 adolescents or 1 in 10 adults)  Chills, sore joints (about 1 person in 10)  Body aches (about 1 person in 3 or 4)  Rash, swollen glands (uncommon)  Moderate problems following Tdap: (Interfered with activities, but did not require medical attention)  Pain where the shot was given (up to 1 in 5 or 6)  Redness or swelling where the shot was given (up to about 1 in 16 adolescents or 1 in 12 adults)  Fever over 102F (about 1 in 100 adolescents or 1 in 250 adults)  Headache (about 1 in 7 adolescents or 1 in 10 adults)  Nausea, vomiting, diarrhea, stomach ache (up to 1 or 3 people in 100)  Swelling of the entire arm where the shot was given (up to about 1 in 500).  Severe problems following Tdap: (Unable to perform usual activities; required medical attention)  Swelling, severe pain, bleeding and redness in the arm where the shot was given (rare).  Problems that could happen after any vaccine:  People sometimes faint after a medical procedure, including vaccination. Sitting or lying down for about 15 minutes can help prevent fainting, and injuries caused by a fall. Tell your doctor if you feel dizzy, or have vision changes or ringing in the ears.  Some people get severe pain in the shoulder and have difficulty moving the arm where a shot was given. This happens very rarely.  Any medication can cause a severe allergic reaction. Such reactions from a vaccine are very rare, estimated at fewer than 1 in a million doses, and would happen within a few minutes to a few hours  after the vaccination. As with any medicine, there is a very remote chance of a vaccine causing a serious injury or death. The safety of vaccines is always being monitored. For more information, visit: http://www.aguilar.org/ 5. What if there is a serious problem? What should I look for? Look for anything that concerns you, such as signs of a severe allergic reaction, very high fever, or unusual behavior. Signs of a severe allergic reaction can include hives, swelling of the face and throat, difficulty breathing, a fast heartbeat, dizziness, and weakness. These would usually start a few minutes to a few hours after the vaccination. What should I do?  If you think it is a severe allergic reaction or other emergency that can't wait, call 9-1-1 or get the person to the nearest hospital. Otherwise, call your doctor.  Afterward, the reaction should be reported to the Vaccine Adverse Event Reporting System (VAERS). Your doctor might file this report, or you can do it yourself through the VAERS web site at www.vaers.SamedayNews.es, or by calling 973-132-1635. ?  VAERS does not give medical advice. 6. The National Vaccine Injury Compensation Program The [employer] Vaccine Injury Compensation Program (VICP) is a federal program that was created to compensate people who may have been injured by certain vaccines. Persons who believe they may have been injured by a vaccine can learn about the program and about filing a claim by calling (612)712-2072 or visiting the [place] website at GoldCloset.com.ee. There is a time limit to file a claim for compensation. 7. How can I learn more?  Ask your doctor. He or she can give you the vaccine package insert or suggest other sources of information.  Call your local or state health department.  Contact the Centers for Disease Control and Prevention (CDC): ? Call (319)489-2702 (1-800-CDC-INFO) or ? Visit CDC's website at http://hunter.com/ CDC Tdap  Vaccine VIS (07/16/13) This information is not intended to replace advice given to you by your health care provider. Make sure you discuss any questions you have with your health care provider. Document Released: 11/08/2011 Document Revised: 01/28/2016 Document Reviewed: 01/28/2016 Elsevier Interactive Patient Education  2017 [employer].

## 2017-08-17 LAB — LIPID PANEL W/O CHOL/HDL RATIO
CHOLESTEROL TOTAL: 128 mg/dL (ref 100–199)
HDL: 73 mg/dL (ref >39)
LDL CALC: 46 mg/dL (ref 0–99)
TRIGLYCERIDES: 45 mg/dL (ref 0–149)
VLDL CHOLESTEROL CAL: 9 mg/dL (ref 5–40)

## 2017-08-17 LAB — COMPREHENSIVE METABOLIC PANEL
ALK PHOS: 68 IU/L (ref 39–117)
ALT: 12 IU/L (ref 0–32)
AST: 19 IU/L (ref 0–40)
Albumin/Globulin Ratio: 2.2 (ref 1.2–2.2)
Albumin: 4.6 g/dL (ref 3.5–5.5)
BILIRUBIN TOTAL: 0.5 mg/dL (ref 0.0–1.2)
BUN / CREAT RATIO: 14 (ref 9–23)
BUN: 13 mg/dL (ref 6–20)
CHLORIDE: 100 mmol/L (ref 96–106)
CO2: 24 mmol/L (ref 20–29)
CREATININE: 0.95 mg/dL (ref 0.57–1.00)
Calcium: 9.4 mg/dL (ref 8.7–10.2)
GFR calc Af Amer: 90 mL/min/{1.73_m2} (ref >59)
GFR calc non Af Amer: 78 mL/min/{1.73_m2} (ref >59)
GLUCOSE: 82 mg/dL (ref 65–99)
Globulin, Total: 2.1 g/dL (ref 1.5–4.5)
Potassium: 4.2 mmol/L (ref 3.5–5.2)
Sodium: 139 mmol/L (ref 134–144)
Total Protein: 6.7 g/dL (ref 6.0–8.5)

## 2017-08-17 LAB — CBC WITH DIFFERENTIAL/PLATELET
BASOS ABS: 0 10*3/uL (ref 0.0–0.2)
Basos: 0 %
EOS (ABSOLUTE): 0.1 10*3/uL (ref 0.0–0.4)
Eos: 1 %
HEMOGLOBIN: 13.5 g/dL (ref 11.1–15.9)
Hematocrit: 39.1 % (ref 34.0–46.6)
Immature Grans (Abs): 0 10*3/uL (ref 0.0–0.1)
Immature Granulocytes: 0 %
LYMPHS ABS: 2.7 10*3/uL (ref 0.7–3.1)
Lymphs: 34 %
MCH: 31.5 pg (ref 26.6–33.0)
MCHC: 34.5 g/dL (ref 31.5–35.7)
MCV: 91 fL (ref 79–97)
MONOCYTES: 7 %
Monocytes Absolute: 0.5 10*3/uL (ref 0.1–0.9)
NEUTROS ABS: 4.6 10*3/uL (ref 1.4–7.0)
Neutrophils: 58 %
PLATELETS: 239 10*3/uL (ref 150–379)
RBC: 4.29 x10E6/uL (ref 3.77–5.28)
RDW: 12.9 % (ref 12.3–15.4)
WBC: 8 10*3/uL (ref 3.4–10.8)

## 2017-08-17 LAB — TSH: TSH: 1.34 u[IU]/mL (ref 0.450–4.500)

## 2017-08-19 DIAGNOSIS — F419 Anxiety disorder, unspecified: Secondary | ICD-10-CM | POA: Insufficient documentation

## 2017-08-19 NOTE — Assessment & Plan Note (Signed)
Good improvement with hydroxyzine prn. Continue current regimen, reiterated importance and benefit of counseling.

## 2017-09-11 ENCOUNTER — Other Ambulatory Visit: Payer: Self-pay | Admitting: Family Medicine

## 2018-08-23 ENCOUNTER — Encounter: Payer: 59 | Admitting: Family Medicine

## 2020-03-26 ENCOUNTER — Other Ambulatory Visit: Payer: Self-pay | Admitting: Pain Medicine

## 2022-01-14 ENCOUNTER — Encounter: Payer: Self-pay | Admitting: Internal Medicine

## 2022-01-18 ENCOUNTER — Ambulatory Visit: Payer: BLUE CROSS/BLUE SHIELD | Admitting: Internal Medicine

## 2022-01-18 ENCOUNTER — Encounter: Payer: Self-pay | Admitting: Internal Medicine

## 2022-01-18 VITALS — BP 126/76 | HR 79 | Ht 64.0 in | Wt 145.2 lb

## 2022-01-18 DIAGNOSIS — Z0000 Encounter for general adult medical examination without abnormal findings: Secondary | ICD-10-CM | POA: Diagnosis not present

## 2022-01-18 DIAGNOSIS — F419 Anxiety disorder, unspecified: Secondary | ICD-10-CM

## 2022-01-18 DIAGNOSIS — R030 Elevated blood-pressure reading, without diagnosis of hypertension: Secondary | ICD-10-CM | POA: Diagnosis not present

## 2022-01-18 DIAGNOSIS — Z0001 Encounter for general adult medical examination with abnormal findings: Secondary | ICD-10-CM | POA: Diagnosis not present

## 2022-01-18 MED ORDER — HYDROXYZINE HCL 10 MG PO TABS: 10.0000 mg | ORAL_TABLET | Freq: Three times a day (TID) | ORAL | 1 refills | Status: DC | PRN

## 2022-01-18 NOTE — Assessment & Plan Note (Signed)
Presenting today to establish care. -Baseline lab work ordered today, including one-time screening for HCV -She declined outstanding vaccinations (influenza and COVID-19) -She is due for a Pap smear, last performed in 2018.  We will plan for follow-up in 1 month for Pap smear. -HM items otherwise up-to-date

## 2022-01-18 NOTE — Assessment & Plan Note (Signed)
Reports increased stress recently and occasionally feels overwhelmed.  She was previously taking Atarax as needed with noted benefit and she requests a new prescription today. -Atarax prescribed for as needed use. -Plan for follow-up in 1 month to reassess anxiety

## 2022-01-18 NOTE — Patient Instructions (Signed)
It was a pleasure to see you today.  Thank you for giving [place] the opportunity to be involved in your care.  Below is a brief recap of your visit and next steps.  We will plan to see you again in 1 month.  Summary We are checking basic labs today I have prescribed Atarax for anxiety. Please let me know if it is helping at your appointment next month.  Next steps Plan for follow up in 1 month for pap smear. I will notify you of lab results tomorrow.

## 2022-01-18 NOTE — Assessment & Plan Note (Signed)
Initial BP 130/80.  Improved to 126/76 by the end of our encounter. -Continue to monitor blood pressure at subsequent appointments

## 2022-01-18 NOTE — Progress Notes (Signed)
New Patient Office Visit  Subjective    Patient ID: Rose Hall, female    DOB: Oct 09, 1980  Age: 41 y.o. MRN: [id number]  CC:  Chief Complaint  Patient presents with   Establish Care   HPI Rose Hall presents to establish care.  She is a 41 year old woman with a past medical history significant for anxiety.  She is asymptomatic today.  She was previously taking Atarax for anxiety, but is not currently taking medications.  Between her job and a busy household, she reports worsening anxiety lately.  Rose Hall says that she occasionally feels overwhelmed.  She is interested in restarting Atarax today for as needed use for anxiety.  She is not interested in starting a daily medication and specifically request to avoid controlled substances.  Acute concerns and outstanding preventative healthcare maintenance items discussed today are individually addressed in A/P below.  Outpatient Encounter Medications as of 01/18/2022  Medication Sig   hydrOXYzine (ATARAX) 10 MG tablet Take 1 tablet (10 mg total) by mouth 3 (three) times daily as needed.   [DISCONTINUED] cyclobenzaprine (FLEXERIL) 10 MG tablet TAKE 1 TABLET (10 MG TOTAL) BY MOUTH AT BEDTIME AS NEEDED FOR MUSCLE SPASMS. (Patient not taking: Reported on 01/18/2022)   [DISCONTINUED] hydrOXYzine (ATARAX/VISTARIL) 10 MG tablet Take 1 tablet (10 mg total) by mouth 3 (three) times daily as needed. (Patient not taking: Reported on 01/18/2022)   [DISCONTINUED] predniSONE (DELTASONE) 10 MG tablet Take 6 tabs day one, 5 tabs day two, 4 tabs day three, etc (Patient not taking: Reported on 01/18/2022)   No facility-administered encounter medications on file as of 01/18/2022.    Past Medical History:  Diagnosis Date   No pertinent past medical history     Past Surgical History:  Procedure Laterality Date   NO PAST SURGERIES      Family History  Problem Relation Age of Onset   Cancer Paternal Grandmother    Fibromyalgia Mother    Arthritis  Mother     Social History   Socioeconomic History   Marital status: Divorced    Spouse name: Not on file   Number of children: Not on file   Years of education: Not on file   Highest education level: Not on file  Occupational History   Occupation: Teacher, music: [employer]  Tobacco Use   Smoking status: Former    Types: Cigarettes    Quit date: 12/16/1998    Years since quitting: 23.1   Smokeless tobacco: Never  Vaping Use   Vaping Use: Never used  Substance and Sexual Activity   Alcohol use: No   Drug use: No   Sexual activity: Yes    Partners: Male    Birth control/protection: None    Comment: tubal  Other Topics Concern   Not on file  Social History Narrative   Not on file   Social Determinants of Health   Financial Resource Strain: Not on file  Food Insecurity: Not on file  Transportation Needs: Not on file  Physical Activity: Not on file  Stress: Not on file  Social Connections: Not on file  Intimate Partner Violence: Not on file    Review of Systems  Constitutional:  Negative for chills and fever.  HENT:  Negative for sore throat.   Respiratory:  Negative for cough and shortness of breath.   Cardiovascular:  Negative for chest pain, palpitations and leg swelling.  Gastrointestinal:  Negative for abdominal pain, blood in stool, constipation, diarrhea,  nausea and vomiting.  Genitourinary:  Negative for dysuria and hematuria.  Musculoskeletal:  Negative for myalgias.  Skin:  Negative for itching and rash.  Neurological:  Negative for dizziness and headaches.  Psychiatric/Behavioral:  Negative for depression and suicidal ideas. The patient is nervous/anxious.    Objective    BP 126/76 (BP Location: Right Arm, Cuff Size: Normal)   Pulse 79   Ht 5' 4"  (1.626 m)   Wt 145 lb 3.2 oz (65.9 kg)   SpO2 99%   BMI 24.92 kg/m   Physical Exam Constitutional:      General: She is not in acute distress.    Appearance: Normal appearance. She is normal  weight. She is not toxic-appearing.  HENT:     Head: Normocephalic and atraumatic.     Right Ear: External ear normal.     Left Ear: External ear normal.     Nose: Nose normal. No congestion or rhinorrhea.     Mouth/Throat:     Mouth: Mucous membranes are moist.     Pharynx: Oropharynx is clear. No oropharyngeal exudate or posterior oropharyngeal erythema.  Eyes:     General: No scleral icterus.    Extraocular Movements: Extraocular movements intact.     Conjunctiva/sclera: Conjunctivae normal.     Pupils: Pupils are equal, round, and reactive to light.  Cardiovascular:     Rate and Rhythm: Normal rate and regular rhythm.     Pulses: Normal pulses.     Heart sounds: Normal heart sounds. No murmur heard.    No friction rub. No gallop.  Pulmonary:     Effort: Pulmonary effort is normal.     Breath sounds: Normal breath sounds. No wheezing, rhonchi or rales.  Abdominal:     General: Abdomen is flat. Bowel sounds are normal. There is no distension.     Palpations: Abdomen is soft.     Tenderness: There is no abdominal tenderness.  Musculoskeletal:        General: No swelling. Normal range of motion.     Cervical back: Normal range of motion.     Right lower leg: No edema.     Left lower leg: No edema.  Lymphadenopathy:     Cervical: No cervical adenopathy.  Skin:    General: Skin is warm and dry.     Capillary Refill: Capillary refill takes less than 2 seconds.     Coloration: Skin is not jaundiced.  Neurological:     General: No focal deficit present.     Mental Status: She is alert and oriented to person, place, and time.     Motor: No weakness.     Gait: Gait normal.  Psychiatric:        Mood and Affect: Mood normal.        Behavior: Behavior normal.        Thought Content: Thought content normal.    Last CBC Lab Results  Component Value Date   WBC 8.0 08/16/2017   HGB 13.5 08/16/2017   HCT 39.1 08/16/2017   MCV 91 08/16/2017   MCH 31.5 08/16/2017   RDW 12.9  08/16/2017   PLT 239 35/32/9924   Last metabolic panel Lab Results  Component Value Date   GLUCOSE 82 08/16/2017   NA 139 08/16/2017   K 4.2 08/16/2017   CL 100 08/16/2017   CO2 24 08/16/2017   BUN 13 08/16/2017   CREATININE 0.95 08/16/2017   GFRNONAA 78 08/16/2017   CALCIUM 9.4 08/16/2017  PROT 6.7 08/16/2017   ALBUMIN 4.6 08/16/2017   LABGLOB 2.1 08/16/2017   AGRATIO 2.2 08/16/2017   BILITOT 0.5 08/16/2017   ALKPHOS 68 08/16/2017   AST 19 08/16/2017   ALT 12 08/16/2017   Last lipids Lab Results  Component Value Date   CHOL 128 08/16/2017   HDL 73 08/16/2017   LDLCALC 46 08/16/2017   TRIG 45 08/16/2017   Last thyroid functions Lab Results  Component Value Date   TSH 1.340 08/16/2017    Assessment & Plan:   Problem List Items Addressed This Visit       Other   Anxiety    Reports increased stress recently and occasionally feels overwhelmed.  She was previously taking Atarax as needed with noted benefit and she requests a new prescription today. -Atarax prescribed for as needed use. -Plan for follow-up in 1 month to reassess anxiety      Relevant Medications   hydrOXYzine (ATARAX) 10 MG tablet   Preventative health care - Primary    Presenting today to establish care. -Baseline lab work ordered today, including one-time screening for HCV -She declined outstanding vaccinations (influenza and COVID-19) -She is due for a Pap smear, last performed in 2018.  We will plan for follow-up in 1 month for Pap smear. -HM items otherwise up-to-date      Relevant Orders   CBC with Differential   CMP14+EGFR   Vitamin D (25 hydroxy)   TSH + free T4   Lipid Profile   B12 and Folate Panel   HgB A1c   HCV Ab w Reflex to Quant PCR   Fe+TIBC+Fer   Elevated blood pressure reading    Initial BP 130/80.  Improved to 126/76 by the end of our encounter. -Continue to monitor blood pressure at subsequent appointments      Return in about 1 month (around 02/18/2022) for  pap smear, anxiety.   Johnette Abraham, MD

## 2022-01-19 ENCOUNTER — Encounter: Payer: Self-pay | Admitting: Internal Medicine

## 2022-01-19 LAB — CBC WITH DIFFERENTIAL/PLATELET
Basophils Absolute: 0 10*3/uL (ref 0.0–0.2)
Basos: 1 %
EOS (ABSOLUTE): 0.1 10*3/uL (ref 0.0–0.4)
Eos: 1 %
Hematocrit: 41.6 % (ref 34.0–46.6)
Hemoglobin: 14.4 g/dL (ref 11.1–15.9)
Immature Grans (Abs): 0 10*3/uL (ref 0.0–0.1)
Immature Granulocytes: 0 %
Lymphocytes Absolute: 1.6 10*3/uL (ref 0.7–3.1)
Lymphs: 31 %
MCH: 33.1 pg — ABNORMAL HIGH (ref 26.6–33.0)
MCHC: 34.6 g/dL (ref 31.5–35.7)
MCV: 96 fL (ref 79–97)
Monocytes Absolute: 0.4 10*3/uL (ref 0.1–0.9)
Monocytes: 8 %
Neutrophils Absolute: 2.9 10*3/uL (ref 1.4–7.0)
Neutrophils: 59 %
Platelets: 217 10*3/uL (ref 150–450)
RBC: 4.35 x10E6/uL (ref 3.77–5.28)
RDW: 11.4 % — ABNORMAL LOW (ref 11.7–15.4)
WBC: 5 10*3/uL (ref 3.4–10.8)

## 2022-01-19 LAB — HCV INTERPRETATION

## 2022-01-19 LAB — CMP14+EGFR
ALT: 11 IU/L (ref 0–32)
AST: 18 IU/L (ref 0–40)
Albumin/Globulin Ratio: 2.6 — ABNORMAL HIGH (ref 1.2–2.2)
Albumin: 5 g/dL — ABNORMAL HIGH (ref 3.9–4.9)
Alkaline Phosphatase: 69 IU/L (ref 44–121)
BUN/Creatinine Ratio: 25 — ABNORMAL HIGH (ref 9–23)
BUN: 21 mg/dL — ABNORMAL HIGH (ref 6–20)
Bilirubin Total: 0.6 mg/dL (ref 0.0–1.2)
CO2: 25 mmol/L (ref 20–29)
Calcium: 9.5 mg/dL (ref 8.7–10.2)
Chloride: 102 mmol/L (ref 96–106)
Creatinine, Ser: 0.83 mg/dL (ref 0.57–1.00)
Globulin, Total: 1.9 g/dL (ref 1.5–4.5)
Glucose: 83 mg/dL (ref 70–99)
Potassium: 4.1 mmol/L (ref 3.5–5.2)
Sodium: 141 mmol/L (ref 134–144)
Total Protein: 6.9 g/dL (ref 6.0–8.5)
eGFR: 92 mL/min/{1.73_m2} (ref >59)

## 2022-01-19 LAB — TSH+FREE T4
Free T4: 1.17 ng/dL (ref 0.82–1.77)
TSH: 1.96 u[IU]/mL (ref 0.450–4.500)

## 2022-01-19 LAB — LIPID PANEL
Chol/HDL Ratio: 1.8 ratio (ref 0.0–4.4)
Cholesterol, Total: 141 mg/dL (ref 100–199)
HDL: 79 mg/dL (ref >39)
LDL Chol Calc (NIH): 51 mg/dL (ref 0–99)
Triglycerides: 45 mg/dL (ref 0–149)
VLDL Cholesterol Cal: 11 mg/dL (ref 5–40)

## 2022-01-19 LAB — IRON,TIBC AND FERRITIN PANEL
Ferritin: 60 ng/mL (ref 15–150)
Iron Saturation: 24 % (ref 15–55)
Iron: 72 ug/dL (ref 27–159)
Total Iron Binding Capacity: 302 ug/dL (ref 250–450)
UIBC: 230 ug/dL (ref 131–425)

## 2022-01-19 LAB — HEMOGLOBIN A1C
Est. average glucose Bld gHb Est-mCnc: 91 mg/dL
Hgb A1c MFr Bld: 4.8 % (ref 4.8–5.6)

## 2022-01-19 LAB — B12 AND FOLATE PANEL
Folate: 17.4 ng/mL (ref >3.0)
Vitamin B-12: 292 pg/mL (ref 232–1245)

## 2022-01-19 LAB — HCV AB W REFLEX TO QUANT PCR: HCV Ab: NONREACTIVE

## 2022-01-19 LAB — VITAMIN D 25 HYDROXY (VIT D DEFICIENCY, FRACTURES): Vit D, 25-Hydroxy: 52.7 ng/mL (ref 30.0–100.0)

## 2022-02-18 ENCOUNTER — Ambulatory Visit (INDEPENDENT_AMBULATORY_CARE_PROVIDER_SITE_OTHER): Payer: BLUE CROSS/BLUE SHIELD | Admitting: Internal Medicine

## 2022-02-18 ENCOUNTER — Other Ambulatory Visit (HOSPITAL_COMMUNITY)
Admission: RE | Admit: 2022-02-18 | Discharge: 2022-02-18 | Disposition: A | Discharge status: Home / Self Care | Payer: BLUE CROSS/BLUE SHIELD | Source: Ambulatory Visit | Attending: Internal Medicine | Admitting: Internal Medicine

## 2022-02-18 ENCOUNTER — Encounter: Payer: Self-pay | Admitting: Internal Medicine

## 2022-02-18 VITALS — BP 110/76 | HR 85 | Ht 64.0 in | Wt 144.0 lb

## 2022-02-18 DIAGNOSIS — F419 Anxiety disorder, unspecified: Secondary | ICD-10-CM | POA: Diagnosis not present

## 2022-02-18 DIAGNOSIS — Z124 Encounter for screening for malignant neoplasm of cervix: Secondary | ICD-10-CM | POA: Insufficient documentation

## 2022-02-18 DIAGNOSIS — R030 Elevated blood-pressure reading, without diagnosis of hypertension: Secondary | ICD-10-CM | POA: Diagnosis not present

## 2022-02-18 NOTE — Assessment & Plan Note (Signed)
Pap smear completed today. 

## 2022-02-18 NOTE — Assessment & Plan Note (Signed)
BP 110/76 today.  No indication to start antihypertensive therapy.

## 2022-02-18 NOTE — Progress Notes (Signed)
Established Patient Office Visit  Subjective   Patient ID: Rose Hall, female    DOB: [birth year]  Age: 41 y.o. MRN: [id number]  Chief Complaint  Patient presents with   Follow-up   Gynecologic Exam   Rose Hall returns today for follow-up.  She is a 41 year old woman with past medical history significant for anxiety.  Last seen by me to establish care on 8/29.  At that time she endorsed increasing stress and feeling overwhelmed.  Atarax was prescribed for as needed use.  Follow-up plan for 1 month to reassess anxiety and to complete her Pap smear.  There have been no acute interval events.  Today she states that she is feeling well.  She reports that her stress and anxiety levels have not changed.  Her stepdaughter's wedding is next weekend and she believes the upcoming week will be particularly stressful.  Rose Hall tried taking Atarax but it just made her tired and did not improve her anxiety level.  She has no additional concerns today and is otherwise asymptomatic.  Patient Active Problem List   Diagnosis Date Noted   Pap smear for cervical cancer screening 02/18/2022   Preventative health care 01/18/2022   Elevated blood pressure reading 01/18/2022   Anxiety 08/19/2017   Past Medical History:  Diagnosis Date   IUGR (intrauterine growth restriction) 12/16/2010   No pertinent past medical history    NSVD (normal spontaneous vaginal delivery) 01/18/2011   Two vessel umbilical cord 3/87/5643   Past Surgical History:  Procedure Laterality Date   NO PAST SURGERIES     Social History   Tobacco Use   Smoking status: Former    Types: Cigarettes    Quit date: 12/16/1998    Years since quitting: 23.1   Smokeless tobacco: Never  Vaping Use   Vaping Use: Never used  Substance Use Topics   Alcohol use: No   Drug use: No   Social History   Socioeconomic History   Marital status: Divorced    Spouse name: Not on file   Number of children: Not on file   Years of education: Not  on file   Highest education level: Not on file  Occupational History   Occupation: Teacher, 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[employer]  Tobacco Use   Smoking status: Former    Types: Cigarettes    Quit date: 12/16/1998    Years since quitting: 23.1   Smokeless tobacco: Never  Vaping Use   Vaping Use: Never used  Substance and Sexual Activity   Alcohol use: No   Drug use: No   Sexual activity: Yes    Partners: Male    Birth control/protection: None    Comment: tubal  Other Topics Concern   Not on file  Social History Narrative   Not on file   Social Determinants of Health   Financial Resource Strain: Not on file  Food Insecurity: Not on file  Transportation Needs: Not on file  Physical Activity: Not on file  Stress: Not on file  Social Connections: Not on file  Intimate Partner Violence: Not on file   Family Status  Relation Name Status   PGM  (Not Specified)   Mother  (Not Specified)   Family History  Problem Relation Age of Onset   Cancer Paternal Grandmother    Fibromyalgia Mother    Arthritis Mother    Allergies  Allergen Reactions   Codeine Nausea And Vomiting   Review of Systems  Psychiatric/Behavioral:  The  patient is nervous/anxious.        Significant stress  All other systems reviewed and are negative.    Objective:     BP 110/76   Pulse 85   Ht 5' 4"  (1.626 m)   Wt 144 lb (65.3 kg)   SpO2 96%   BMI 24.72 kg/m  BP Readings from Last 3 Encounters:  02/18/22 110/76  01/18/22 126/76  08/16/17 120/86   Physical Exam Exam conducted with a chaperone present.  Constitutional:      General: She is not in acute distress.    Appearance: Normal appearance. She is normal weight. She is not toxic-appearing.  HENT:     Head: Normocephalic and atraumatic.     Right Ear: External ear normal.     Left Ear: External ear normal.     Nose: Nose normal. No congestion or rhinorrhea.     Mouth/Throat:     Mouth: Mucous membranes are moist.     Pharynx: Oropharynx is  clear. No oropharyngeal exudate or posterior oropharyngeal erythema.  Eyes:     General: No scleral icterus.    Extraocular Movements: Extraocular movements intact.     Conjunctiva/sclera: Conjunctivae normal.     Pupils: Pupils are equal, round, and reactive to light.  Cardiovascular:     Rate and Rhythm: Normal rate and regular rhythm.     Pulses: Normal pulses.     Heart sounds: Normal heart sounds. No murmur heard.    No friction rub. No gallop.  Pulmonary:     Effort: Pulmonary effort is normal.     Breath sounds: Normal breath sounds. No wheezing, rhonchi or rales.  Abdominal:     General: Abdomen is flat. Bowel sounds are normal. There is no distension.     Palpations: Abdomen is soft.     Tenderness: There is no abdominal tenderness.  Genitourinary:    General: Normal vulva.     Exam position: Knee-chest position.     Pubic Area: No rash.      Labia:        Right: No tenderness or lesion.        Left: No tenderness or lesion.      Vagina: No vaginal discharge or bleeding.     Cervix: No friability or cervical bleeding.     Uterus: Normal.      Adnexa: Right adnexa normal and left adnexa normal.     Rectum: Normal.  Musculoskeletal:        General: No swelling. Normal range of motion.     Cervical back: Normal range of motion.     Right lower leg: No edema.     Left lower leg: No edema.  Lymphadenopathy:     Cervical: No cervical adenopathy.  Skin:    General: Skin is warm and dry.     Capillary Refill: Capillary refill takes less than 2 seconds.     Coloration: Skin is not jaundiced.  Neurological:     General: No focal deficit present.     Mental Status: She is alert and oriented to person, place, and time.     Motor: No weakness.     Gait: Gait normal.  Psychiatric:        Mood and Affect: Mood normal.        Behavior: Behavior normal.        Thought Content: Thought content normal.    Last CBC Lab Results  Component Value Date   WBC 5.0 01/18/2022  HGB 14.4 01/18/2022   HCT 41.6 01/18/2022   MCV 96 01/18/2022   MCH 33.1 (H) 01/18/2022   RDW 11.4 (L) 01/18/2022   PLT 217 29/79/8921   Last metabolic panel Lab Results  Component Value Date   GLUCOSE 83 01/18/2022   NA 141 01/18/2022   K 4.1 01/18/2022   CL 102 01/18/2022   CO2 25 01/18/2022   BUN 21 (H) 01/18/2022   CREATININE 0.83 01/18/2022   EGFR 92 01/18/2022   CALCIUM 9.5 01/18/2022   PROT 6.9 01/18/2022   ALBUMIN 5.0 (H) 01/18/2022   LABGLOB 1.9 01/18/2022   AGRATIO 2.6 (H) 01/18/2022   BILITOT 0.6 01/18/2022   ALKPHOS 69 01/18/2022   AST 18 01/18/2022   ALT 11 01/18/2022   Last lipids Lab Results  Component Value Date   CHOL 141 01/18/2022   HDL 79 01/18/2022   LDLCALC 51 01/18/2022   TRIG 45 01/18/2022   CHOLHDL 1.8 01/18/2022   Last hemoglobin A1c Lab Results  Component Value Date   HGBA1C 4.8 01/18/2022   Last thyroid functions Lab Results  Component Value Date   TSH 1.960 01/18/2022   Last vitamin D Lab Results  Component Value Date   VD25OH 52.7 01/18/2022   Last vitamin B12 and Folate Lab Results  Component Value Date   VITAMINB12 292 01/18/2022   FOLATE 17.4 01/18/2022     Assessment & Plan:   Problem List Items Addressed This Visit       Anxiety    Returning for follow-up today.  She was previously prescribed Atarax for as needed use but states that it simply made her sleepy and did not relieve her anxiety.  Previously she stated that Atarax had been effective.  Today she states that her stress and anxiety levels are unchanged.  She has several children at home and her stepdaughter's wedding is in 1 week. -We reviewed various treatment options including starting a daily scheduled medication such as an SSRI for anxiety, trying a different as needed medication, or referring to counseling.  Rose Hall would like to take time this week and to consider her options and she will follow-up with me next week.      Elevated blood pressure  reading    BP 110/76 today.  No indication to start antihypertensive therapy.      Pap smear for cervical cancer screening - Primary    Pap smear completed today      Relevant Orders   Cytology - PAP    Return in about 6 months (around 08/19/2022).    Johnette Abraham, MD

## 2022-02-18 NOTE — Assessment & Plan Note (Signed)
Returning for follow-up today.  She was previously prescribed Atarax for as needed use but states that it simply made her sleepy and did not relieve her anxiety.  Previously she stated that Atarax had been effective.  Today she states that her stress and anxiety levels are unchanged.  She has several children at home and her stepdaughter's wedding is in 1 week. -We reviewed various treatment options including starting a daily scheduled medication such as an SSRI for anxiety, trying a different as needed medication, or referring to counseling.  Rose Hall would like to take time this week and to consider her options and she will follow-up with me next week.

## 2022-02-18 NOTE — Patient Instructions (Signed)
It was a pleasure to see you today.  Thank you for giving [place] the opportunity to be involved in your care.  Below is a brief recap of your visit and next steps.  We will plan to see you again in 6 months.  Summary We completed your pap smear today  Next steps Follow up in 6 months I will notify you of pap smear results

## 2022-02-22 ENCOUNTER — Encounter: Payer: Self-pay | Admitting: Internal Medicine

## 2022-02-22 DIAGNOSIS — F419 Anxiety disorder, unspecified: Secondary | ICD-10-CM

## 2022-02-22 LAB — CYTOLOGY - PAP
Adequacy: ABSENT
Comment: NEGATIVE
Diagnosis: NEGATIVE
High risk HPV: NEGATIVE

## 2022-02-22 MED ORDER — DIAZEPAM 5 MG PO TABS: 5.0000 mg | ORAL_TABLET | Freq: Two times a day (BID) | ORAL | 0 refills | Status: DC | PRN

## 2022-05-13 ENCOUNTER — Encounter: Payer: Self-pay | Admitting: Internal Medicine

## 2022-05-13 DIAGNOSIS — J392 Other diseases of pharynx: Secondary | ICD-10-CM

## 2022-05-13 MED ORDER — LIDOCAINE VISCOUS HCL 2 % MT SOLN: 15.0000 mL | OROMUCOSAL | 0 refills | Status: DC | PRN

## 2022-08-19 ENCOUNTER — Encounter: Payer: Self-pay | Admitting: Internal Medicine

## 2022-08-19 ENCOUNTER — Ambulatory Visit: Payer: BLUE CROSS/BLUE SHIELD | Admitting: Internal Medicine

## 2022-08-19 VITALS — BP 115/83 | HR 88 | Ht 64.0 in | Wt 148.2 lb

## 2022-08-19 DIAGNOSIS — Z1239 Encounter for other screening for malignant neoplasm of breast: Secondary | ICD-10-CM | POA: Diagnosis not present

## 2022-08-19 DIAGNOSIS — F419 Anxiety disorder, unspecified: Secondary | ICD-10-CM

## 2022-08-19 NOTE — Assessment & Plan Note (Signed)
Recently turned 56.  No family history of breast cancer.  She has declined breast cancer screening for now.

## 2022-08-19 NOTE — Assessment & Plan Note (Signed)
She has recently changed jobs and reports recent stress/anxiety related to settling into her new roles.  She states that she is able to focus at work and her ability to complete tasks is not impaired by overwhelming anxiety.  She is not interested in starting any medications currently.

## 2022-08-19 NOTE — Progress Notes (Signed)
Established Patient Office Visit  Subjective   Patient ID: Rose Hall, female    DOB: 20-Feb-1981  Age: 42 y.o. MRN: [id number]  Chief Complaint  Patient presents with   Anxiety    Follow up   Rose Hall returns to care today for 109-month follow-up.  She was last evaluated by me on 02/18/22 at which time she endorsed significant anxiety attributed to multiple stressors.  A short duration of Valium around the time of stepdaughter's wedding.  There have been no acute interval events.  Rose Hall reports feeling well today.  She has recently changed jobs and endorses stress related to settling into her new roles.  She has no acute concerns to discuss today.  Past Medical History:  Diagnosis Date   IUGR (intrauterine growth restriction) 12/16/2010   No pertinent past medical history    NSVD (normal spontaneous vaginal delivery) 01/18/2011   Two vessel umbilical cord [id number]   Past Surgical History:  Procedure Laterality Date   NO PAST SURGERIES     Social History   Tobacco Use   Smoking status: Former    Types: Cigarettes    Quit date: 12/16/1998    Years since quitting: 23.6   Smokeless tobacco: Never  Vaping Use   Vaping Use: Never used  Substance Use Topics   Alcohol use: No   Drug use: No   Family History  Problem Relation Age of Onset   Cancer Paternal Grandmother    Fibromyalgia Mother    Arthritis Mother    Allergies  Allergen Reactions   Codeine Nausea And Vomiting   Review of Systems  Constitutional:  Negative for chills and fever.  HENT:  Negative for sore throat.   Respiratory:  Negative for cough and shortness of breath.   Cardiovascular:  Negative for chest pain, palpitations and leg swelling.  Gastrointestinal:  Negative for abdominal pain, blood in stool, constipation, diarrhea, nausea and vomiting.  Genitourinary:  Negative for dysuria and hematuria.  Musculoskeletal:  Negative for myalgias.  Skin:  Negative for itching and rash.  Neurological:   Negative for dizziness and headaches.  Psychiatric/Behavioral:  Negative for depression and suicidal ideas.      Objective:     BP 115/83   Pulse 88   Ht 5\' 4"  (1.626 m)   Wt 148 lb 3.2 oz (67.2 kg)   SpO2 97%   BMI 25.44 kg/m  BP Readings from Last 3 Encounters:  08/19/22 115/83  02/18/22 110/76  01/18/22 126/76   Physical Exam Exam conducted with a chaperone present.  Constitutional:      General: She is not in acute distress.    Appearance: Normal appearance. She is normal weight. She is not toxic-appearing.  HENT:     Head: Normocephalic and atraumatic.     Right Ear: External ear normal.     Left Ear: External ear normal.     Nose: Nose normal. No congestion or rhinorrhea.     Mouth/Throat:     Mouth: Mucous membranes are moist.     Pharynx: Oropharynx is clear. No oropharyngeal exudate or posterior oropharyngeal erythema.  Eyes:     General: No scleral icterus.    Extraocular Movements: Extraocular movements intact.     Conjunctiva/sclera: Conjunctivae normal.     Pupils: Pupils are equal, round, and reactive to light.  Cardiovascular:     Rate and Rhythm: Normal rate and regular rhythm.     Pulses: Normal pulses.     Heart sounds: Normal  heart sounds. No murmur heard.    No friction rub. No gallop.  Pulmonary:     Effort: Pulmonary effort is normal.     Breath sounds: Normal breath sounds. No wheezing, rhonchi or rales.  Abdominal:     General: Abdomen is flat. Bowel sounds are normal. There is no distension.     Palpations: Abdomen is soft.     Tenderness: There is no abdominal tenderness.  Genitourinary:    General: Normal vulva.     Exam position: Knee-chest position.     Pubic Area: No rash.      Labia:        Right: No tenderness or lesion.        Left: No tenderness or lesion.      Vagina: No vaginal discharge or bleeding.     Cervix: No friability or cervical bleeding.     Uterus: Normal.      Adnexa: Right adnexa normal and left adnexa normal.      Rectum: Normal.  Musculoskeletal:        General: No swelling. Normal range of motion.     Cervical back: Normal range of motion.     Right lower leg: No edema.     Left lower leg: No edema.  Lymphadenopathy:     Cervical: No cervical adenopathy.  Skin:    General: Skin is warm and dry.     Capillary Refill: Capillary refill takes less than 2 seconds.     Coloration: Skin is not jaundiced.  Neurological:     General: No focal deficit present.     Mental Status: She is alert and oriented to person, place, and time.     Motor: No weakness.     Gait: Gait normal.  Psychiatric:        Mood and Affect: Mood normal.        Behavior: Behavior normal.        Thought Content: Thought content normal.   Last CBC Lab Results  Component Value Date   WBC 5.0 01/18/2022   HGB 14.4 01/18/2022   HCT 41.6 01/18/2022   MCV 96 01/18/2022   MCH 33.1 (H) 01/18/2022   RDW 11.4 (L) 01/18/2022   PLT 217 [id number]   Last metabolic panel Lab Results  Component Value Date   GLUCOSE 83 01/18/2022   NA 141 01/18/2022   K 4.1 01/18/2022   CL 102 01/18/2022   CO2 25 01/18/2022   BUN 21 (H) 01/18/2022   CREATININE 0.83 01/18/2022   EGFR 92 01/18/2022   CALCIUM 9.5 01/18/2022   PROT 6.9 01/18/2022   ALBUMIN 5.0 (H) 01/18/2022   LABGLOB 1.9 01/18/2022   AGRATIO 2.6 (H) 01/18/2022   BILITOT 0.6 01/18/2022   ALKPHOS 69 01/18/2022   AST 18 01/18/2022   ALT 11 01/18/2022   Last lipids Lab Results  Component Value Date   CHOL 141 01/18/2022   HDL 79 01/18/2022   LDLCALC 51 01/18/2022   TRIG 45 01/18/2022   CHOLHDL 1.8 01/18/2022   Last hemoglobin A1c Lab Results  Component Value Date   HGBA1C 4.8 01/18/2022   Last thyroid functions Lab Results  Component Value Date   TSH 1.960 01/18/2022   Last vitamin D Lab Results  Component Value Date   VD25OH 52.7 01/18/2022   Last vitamin B12 and Folate Lab Results  Component Value Date   VITAMINB12 292 01/18/2022   FOLATE 17.4  01/18/2022   The 10-year ASCVD risk score (Arnett  DK, et al., 2019) is: 0.1%    Assessment & Plan:   Problem List Items Addressed This Visit       Anxiety - Primary    She has recently changed jobs and reports recent stress/anxiety related to settling into her new roles.  She states that she is able to focus at work and her ability to complete tasks is not impaired by overwhelming anxiety.  She is not interested in starting any medications currently.      Breast cancer screening    Recently turned 62.  No family history of breast cancer.  She has declined breast cancer screening for now.       Return in about 6 months (around 02/19/2023).    Johnette Abraham, MD

## 2022-08-19 NOTE — Patient Instructions (Signed)
It was a pleasure to see you today.  Thank you for giving [place] the opportunity to be involved in your care.  Below is a brief recap of your visit and next steps.  We will plan to see you again in 6 months  Summary No medication changes today Can try B12 supplement for focus / memory Follow up in 6 months

## 2022-12-07 ENCOUNTER — Encounter: Payer: Self-pay | Admitting: Internal Medicine

## 2022-12-08 ENCOUNTER — Other Ambulatory Visit: Payer: Self-pay | Admitting: Internal Medicine

## 2022-12-08 DIAGNOSIS — F419 Anxiety disorder, unspecified: Secondary | ICD-10-CM

## 2022-12-08 MED ORDER — DIAZEPAM 5 MG PO TABS: 5.0000 mg | ORAL_TABLET | Freq: Two times a day (BID) | ORAL | 0 refills | Status: AC | PRN

## 2023-02-20 ENCOUNTER — Ambulatory Visit: Payer: BLUE CROSS/BLUE SHIELD | Admitting: Internal Medicine

## 2023-02-22 ENCOUNTER — Encounter: Payer: Self-pay | Admitting: Internal Medicine

## 2024-03-08 ENCOUNTER — Ambulatory Visit: Payer: Self-pay

## 2024-03-08 NOTE — Telephone Encounter (Signed)
 FYI Only or Action Required?: FYI only for provider.  Patient was last seen in primary care on 08/19/2022 by Melvenia Manus BRAVO, MD.  Called Nurse Triage reporting Flank Pain.  Symptoms began today.  Interventions attempted: Nothing.  Symptoms are: unchanged.  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 She has never experienced this pain in the past and denies all other symptoms. Patient will proceed to urgent care.    1. LOCATION: Where does it hurt? (e.g., left, right)     Left flank  2. ONSET: When did the pain start?     Today couple hours ago 3. SEVERITY: How bad is the pain? (e.g., Scale 1-10; mild, moderate, or severe)     7/10  4. PATTERN: Does the pain come and go, or is it constant?      Constant  5. CAUSE: What do you think is causing the pain?     Unsure  6. OTHER SYMPTOMS:  Do you have any other symptoms? (e.g., fever, abdomen pain, vomiting, leg weakness, burning with urination, blood in urine)     Denies  7. PREGNANCY:  Is there any chance you are pregnant? When was your last menstrual period?  Protocols used: Flank Pain-A-AH
